# Patient Record
Sex: Female | Born: 1985 | Race: Black or African American | Hispanic: No | Marital: Single | Smoking: Former smoker
Health system: Southern US, Community
[De-identification: ages and names within clinical notes are randomized; demographics above are authoritative.]

## PROBLEM LIST (undated history)

## (undated) DIAGNOSIS — I10 Essential (primary) hypertension: Secondary | ICD-10-CM

## (undated) DIAGNOSIS — J45909 Unspecified asthma, uncomplicated: Secondary | ICD-10-CM

---

## 2010-04-15 ENCOUNTER — Encounter: Payer: Self-pay | Admitting: Obstetrics and Gynecology

## 2010-05-24 ENCOUNTER — Encounter: Payer: Self-pay | Admitting: Obstetrics and Gynecology

## 2010-05-24 ENCOUNTER — Ambulatory Visit: Payer: Self-pay | Admitting: Obstetrics & Gynecology

## 2010-06-10 ENCOUNTER — Encounter: Payer: Self-pay | Admitting: Obstetrics & Gynecology

## 2010-08-28 ENCOUNTER — Observation Stay: Payer: Self-pay | Admitting: Obstetrics & Gynecology

## 2010-10-07 ENCOUNTER — Encounter: Payer: Self-pay | Admitting: Maternal and Fetal Medicine

## 2010-11-01 ENCOUNTER — Inpatient Hospital Stay: Payer: Self-pay

## 2013-04-22 ENCOUNTER — Ambulatory Visit: Payer: Self-pay

## 2013-04-23 LAB — GC/CHLAMYDIA PROBE AMP

## 2013-04-25 ENCOUNTER — Ambulatory Visit: Payer: Self-pay

## 2013-04-27 ENCOUNTER — Ambulatory Visit: Payer: Self-pay

## 2015-03-31 ENCOUNTER — Ambulatory Visit
Admission: EM | Admit: 2015-03-31 | Discharge: 2015-03-31 | Disposition: A | Discharge status: Home / Self Care | Payer: Self-pay | Attending: Emergency Medicine | Admitting: Emergency Medicine

## 2015-03-31 ENCOUNTER — Ambulatory Visit: Payer: Self-pay

## 2015-03-31 ENCOUNTER — Emergency Department: Payer: Self-pay

## 2015-03-31 ENCOUNTER — Emergency Department
Admission: EM | Admit: 2015-03-31 | Discharge: 2015-04-01 | Disposition: A | Discharge status: Home / Self Care | Payer: Self-pay | Attending: Emergency Medicine | Admitting: Emergency Medicine

## 2015-03-31 ENCOUNTER — Encounter: Payer: Self-pay | Admitting: Radiology

## 2015-03-31 ENCOUNTER — Encounter: Payer: Self-pay | Admitting: Emergency Medicine

## 2015-03-31 DIAGNOSIS — I10 Essential (primary) hypertension: Secondary | ICD-10-CM | POA: Insufficient documentation

## 2015-03-31 DIAGNOSIS — J189 Pneumonia, unspecified organism: Secondary | ICD-10-CM

## 2015-03-31 DIAGNOSIS — Z79899 Other long term (current) drug therapy: Secondary | ICD-10-CM | POA: Insufficient documentation

## 2015-03-31 DIAGNOSIS — R000 Tachycardia, unspecified: Secondary | ICD-10-CM | POA: Insufficient documentation

## 2015-03-31 DIAGNOSIS — Z3202 Encounter for pregnancy test, result negative: Secondary | ICD-10-CM | POA: Insufficient documentation

## 2015-03-31 DIAGNOSIS — J159 Unspecified bacterial pneumonia: Secondary | ICD-10-CM | POA: Insufficient documentation

## 2015-03-31 DIAGNOSIS — J45901 Unspecified asthma with (acute) exacerbation: Secondary | ICD-10-CM | POA: Insufficient documentation

## 2015-03-31 DIAGNOSIS — R062 Wheezing: Secondary | ICD-10-CM

## 2015-03-31 DIAGNOSIS — Z87891 Personal history of nicotine dependence: Secondary | ICD-10-CM | POA: Insufficient documentation

## 2015-03-31 HISTORY — DX: Essential (primary) hypertension: I10

## 2015-03-31 HISTORY — DX: Unspecified asthma, uncomplicated: J45.909

## 2015-03-31 LAB — CBC WITH DIFFERENTIAL/PLATELET
Basophils Absolute: 0 10*3/uL (ref 0–0.1)
Basophils Relative: 0 %
EOS PCT: 0 %
Eosinophils Absolute: 0 10*3/uL (ref 0–0.7)
HEMATOCRIT: 36.9 % (ref 35.0–47.0)
Hemoglobin: 11.9 g/dL — ABNORMAL LOW (ref 12.0–16.0)
LYMPHS ABS: 0.4 10*3/uL — AB (ref 1.0–3.6)
LYMPHS PCT: 3 %
MCH: 27.2 pg (ref 26.0–34.0)
MCHC: 32.3 g/dL (ref 32.0–36.0)
MCV: 84.4 fL (ref 80.0–100.0)
MONO ABS: 0.2 10*3/uL (ref 0.2–0.9)
MONOS PCT: 2 %
Neutro Abs: 12 10*3/uL — ABNORMAL HIGH (ref 1.4–6.5)
Neutrophils Relative %: 95 %
PLATELETS: 364 10*3/uL (ref 150–440)
RBC: 4.38 MIL/uL (ref 3.80–5.20)
RDW: 15 % — AB (ref 11.5–14.5)
WBC: 12.6 10*3/uL — ABNORMAL HIGH (ref 3.6–11.0)

## 2015-03-31 LAB — RAPID INFLUENZA A&B ANTIGENS
Influenza A (ARMC): NOT DETECTED
Influenza B (ARMC): NOT DETECTED

## 2015-03-31 LAB — BASIC METABOLIC PANEL
Anion gap: 7 (ref 5–15)
BUN: 8 mg/dL (ref 6–20)
CALCIUM: 9.2 mg/dL (ref 8.9–10.3)
CO2: 22 mmol/L (ref 22–32)
Chloride: 109 mmol/L (ref 101–111)
Creatinine, Ser: 0.59 mg/dL (ref 0.44–1.00)
GFR calc Af Amer: >60 mL/min (ref >60)
GLUCOSE: 166 mg/dL — AB (ref 65–99)
Potassium: 3.7 mmol/L (ref 3.5–5.1)
Sodium: 138 mmol/L (ref 135–145)

## 2015-03-31 LAB — POC URINE PREG, ED: Preg Test, Ur: NEGATIVE

## 2015-03-31 MED ORDER — ALBUTEROL SULFATE (2.5 MG/3ML) 0.083% IN NEBU
10.0000 mg/h | INHALATION_SOLUTION | Freq: Once | RESPIRATORY_TRACT | Status: DC
  Filled 2015-03-31: qty 3

## 2015-03-31 MED ORDER — IPRATROPIUM BROMIDE 0.02 % IN SOLN
0.5000 mg | Freq: Once | RESPIRATORY_TRACT | Status: AC
  Administered 2015-03-31: 0.5 mg via RESPIRATORY_TRACT

## 2015-03-31 MED ORDER — IPRATROPIUM BROMIDE 0.02 % IN SOLN
RESPIRATORY_TRACT | Status: AC
  Administered 2015-03-31: 0.5 mg via RESPIRATORY_TRACT
  Filled 2015-03-31: qty 5

## 2015-03-31 MED ORDER — IPRATROPIUM BROMIDE 0.02 % IN SOLN
0.5000 mg | RESPIRATORY_TRACT | Status: AC
  Administered 2015-03-31: 0.5 mg via RESPIRATORY_TRACT

## 2015-03-31 MED ORDER — MAGNESIUM SULFATE 2 GM/50ML IV SOLN
2.0000 g | Freq: Once | INTRAVENOUS | Status: AC
  Administered 2015-03-31: 2 g via INTRAVENOUS
  Filled 2015-03-31: qty 50

## 2015-03-31 MED ORDER — IPRATROPIUM-ALBUTEROL 0.5-2.5 (3) MG/3ML IN SOLN
3.0000 mL | Freq: Once | RESPIRATORY_TRACT | Status: AC
  Administered 2015-03-31: 3 mL via RESPIRATORY_TRACT

## 2015-03-31 MED ORDER — IPRATROPIUM-ALBUTEROL 0.5-2.5 (3) MG/3ML IN SOLN
RESPIRATORY_TRACT | Status: AC
  Administered 2015-03-31: 3 mL via RESPIRATORY_TRACT
  Filled 2015-03-31: qty 3

## 2015-03-31 MED ORDER — IPRATROPIUM-ALBUTEROL 0.5-2.5 (3) MG/3ML IN SOLN
3.0000 mL | Freq: Four times a day (QID) | RESPIRATORY_TRACT | Status: DC
  Administered 2015-03-31: 3 mL via RESPIRATORY_TRACT

## 2015-03-31 MED ORDER — ALBUTEROL SULFATE (2.5 MG/3ML) 0.083% IN NEBU
2.5000 mg | INHALATION_SOLUTION | Freq: Once | RESPIRATORY_TRACT | Status: AC
  Administered 2015-03-31: 2.5 mg via RESPIRATORY_TRACT
  Filled 2015-03-31: qty 3

## 2015-03-31 MED ORDER — PREDNISONE 20 MG PO TABS: 40.0000 mg | ORAL_TABLET | Freq: Every day | ORAL | Status: DC

## 2015-03-31 MED ORDER — PREDNISONE 50 MG PO TABS
60.0000 mg | ORAL_TABLET | Freq: Once | ORAL | Status: AC
  Administered 2015-03-31: 60 mg via ORAL

## 2015-03-31 MED ORDER — LEVOFLOXACIN 750 MG PO TABS
750.0000 mg | ORAL_TABLET | Freq: Once | ORAL | Status: AC
  Administered 2015-03-31: 750 mg via ORAL
  Filled 2015-03-31: qty 1

## 2015-03-31 MED ORDER — ALBUTEROL SULFATE (2.5 MG/3ML) 0.083% IN NEBU
2.5000 mg | INHALATION_SOLUTION | Freq: Once | RESPIRATORY_TRACT | Status: AC
  Administered 2015-03-31: 2.5 mg via RESPIRATORY_TRACT

## 2015-03-31 MED ORDER — ALBUTEROL SULFATE HFA 108 (90 BASE) MCG/ACT IN AERS: 2.0000 | INHALATION_SPRAY | Freq: Four times a day (QID) | RESPIRATORY_TRACT | Status: DC | PRN

## 2015-03-31 MED ORDER — LEVOFLOXACIN 750 MG PO TABS: 750.0000 mg | ORAL_TABLET | Freq: Every day | ORAL | Status: AC

## 2015-03-31 MED ORDER — IOHEXOL 350 MG/ML SOLN
100.0000 mL | Freq: Once | INTRAVENOUS | Status: AC | PRN
Start: 2015-03-31 — End: 2015-03-31
  Administered 2015-03-31: 100 mL via INTRAVENOUS

## 2015-03-31 NOTE — ED Notes (Signed)
Patient transported to CT 

## 2015-03-31 NOTE — Discharge Instructions (Signed)
Go directly to the emergency department as discussed. This is very important.   Follow up with your primary care physician as directed. Return to Urgent care for new or worsening concerns.   Asthma, Acute Bronchospasm Acute bronchospasm caused by asthma is also referred to as an asthma attack. Bronchospasm means your air passages become narrowed. The narrowing is caused by inflammation and tightening of the muscles in the air tubes (bronchi) in your lungs. This can make it hard to breathe or cause you to wheeze and cough. CAUSES Possible triggers are:  Animal dander from the skin, hair, or feathers of animals.  Dust mites contained in house dust.  Cockroaches.  Pollen from trees or grass.  Mold.  Cigarette or tobacco smoke.  Air pollutants such as dust, household cleaners, hair sprays, aerosol sprays, paint fumes, strong chemicals, or strong odors.  Cold air or weather changes. Cold air may trigger inflammation. Winds increase molds and pollens in the air.  Strong emotions such as crying or laughing hard.  Stress.  Certain medicines such as aspirin or beta-blockers.  Sulfites in foods and drinks, such as dried fruits and wine.  Infections or inflammatory conditions, such as a flu, cold, or inflammation of the nasal membranes (rhinitis).  Gastroesophageal reflux disease (GERD). GERD is a condition where stomach acid backs up into your esophagus.  Exercise or strenuous activity. SIGNS AND SYMPTOMS   Wheezing.  Excessive coughing, particularly at night.  Chest tightness.  Shortness of breath. DIAGNOSIS  Your health care provider will ask you about your medical history and perform a physical exam. A chest X-ray or blood testing may be performed to look for other causes of your symptoms or other conditions that may have triggered your asthma attack. TREATMENT  Treatment is aimed at reducing inflammation and opening up the airways in your lungs. Most asthma attacks are  treated with inhaled medicines. These include quick relief or rescue medicines (such as bronchodilators) and controller medicines (such as inhaled corticosteroids). These medicines are sometimes given through an inhaler or a nebulizer. Systemic steroid medicine taken by mouth or given through an IV tube also can be used to reduce the inflammation when an attack is moderate or severe. Antibiotic medicines are only used if a bacterial infection is present.  HOME CARE INSTRUCTIONS   Rest.  Drink plenty of liquids. This helps the mucus to remain thin and be easily coughed up. Only use caffeine in moderation and do not use alcohol until you have recovered from your illness.  Do not smoke. Avoid being exposed to secondhand smoke.  You play a critical role in keeping yourself in good health. Avoid exposure to things that cause you to wheeze or to have breathing problems.  Keep your medicines up-to-date and available. Carefully follow your health care provider's treatment plan.  Take your medicine exactly as prescribed.  When pollen or pollution is bad, keep windows closed and use an air conditioner or go to places with air conditioning.  Asthma requires careful medical care. See your health care provider for a follow-up as advised. If you are more than [redacted] weeks pregnant and you were prescribed any new medicines, let your obstetrician know about the visit and how you are doing. Follow up with your health care provider as directed.  After you have recovered from your asthma attack, make an appointment with your outpatient doctor to talk about ways to reduce the likelihood of future attacks. If you do not have a doctor who manages your  asthma, make an appointment with a primary care doctor to discuss your asthma. SEEK IMMEDIATE MEDICAL CARE IF:   You are getting worse.  You have trouble breathing. If severe, call your local emergency services (911 in the [place]  You develop chest pain or  discomfort.  You are vomiting.  You are not able to keep fluids down.  You are coughing up yellow, green, brown, or bloody sputum.  You have a fever and your symptoms suddenly get worse.  You have trouble swallowing. MAKE SURE YOU:   Understand these instructions.  Will watch your condition.  Will get help right away if you are not doing well or get worse.   This information is not intended to replace advice given to you by your health care provider. Make sure you discuss any questions you have with your health care provider.   Document Released: 09/21/2006 Document Revised: 06/11/2013 Document Reviewed: 12/12/2012 Elsevier Interactive Patient Education [employer]2016 [employer] Inc.

## 2015-03-31 NOTE — Discharge Instructions (Signed)
Please seek medical attention for any high fevers, chest pain, shortness of breath, change in behavior, persistent vomiting, bloody stool or any other new or concerning symptoms. ° °Community-Acquired Pneumonia, Adult °Pneumonia is an infection of the lungs. There are different types of pneumonia. One type can develop while a person is in a hospital. A different type, called community-acquired pneumonia, develops in people who are not, or have not recently been, in the hospital or other health care facility.  °CAUSES °Pneumonia may be caused by bacteria, viruses, or funguses. Community-acquired pneumonia is often caused by Streptococcus pneumonia bacteria. These bacteria are often passed from one person to another by breathing in droplets from the cough or sneeze of an infected person. °RISK FACTORS °The condition is more likely to develop in: °· People who have chronic diseases, such as chronic obstructive pulmonary disease (COPD), asthma, congestive heart failure, cystic fibrosis, diabetes, or kidney disease. °· People who have early-stage or late-stage HIV. °· People who have sickle cell disease. °· People who have had their spleen removed (splenectomy). °· People who have poor dental hygiene. °· People who have medical conditions that increase the risk of breathing in (aspirating) secretions their own mouth and nose.   °· People who have a weakened immune system (immunocompromised). °· People who smoke. °· People who travel to areas where pneumonia-causing germs commonly exist. °· People who are around animal habitats or animals that have pneumonia-causing germs, including birds, bats, rabbits, cats, and farm animals. °SYMPTOMS °Symptoms of this condition include: °· A dry cough. °· A wet (productive) cough. °· Fever. °· Sweating. °· Chest pain, especially when breathing deeply or coughing. °· Rapid breathing or difficulty breathing. °· Shortness of breath. °· Shaking chills. °· Fatigue. °· Muscle  aches. °DIAGNOSIS °Your health care provider will take a medical history and perform a physical exam. You may also have other tests, including: °· Imaging studies of your chest, including X-rays. °· Tests to check your blood oxygen level and other blood gases. °· Other tests on blood, mucus (sputum), fluid around your lungs (pleural fluid), and urine. °If your pneumonia is severe, other tests may be done to identify the specific cause of your illness. °TREATMENT °The type of treatment that you receive depends on many factors, such as the cause of your pneumonia, the medicines you take, and other medical conditions that you have. For most adults, treatment and recovery from pneumonia may occur at home. In some cases, treatment must happen in a hospital. Treatment may include: °· Antibiotic medicines, if the pneumonia was caused by bacteria. °· Antiviral medicines, if the pneumonia was caused by a virus. °· Medicines that are given by mouth or through an IV tube. °· Oxygen. °· Respiratory therapy. °Although rare, treating severe pneumonia may include: °· Mechanical ventilation. This is done if you are not breathing well on your own and you cannot maintain a safe blood oxygen level. °· Thoracentesis. This procedure removes fluid around one lung or both lungs to help you breathe better. °HOME CARE INSTRUCTIONS °· Take over-the-counter and prescription medicines only as told by your health care provider. °¨ Only take cough medicine if you are losing sleep. Understand that cough medicine can prevent your body's natural ability to remove mucus from your lungs. °¨ If you were prescribed an antibiotic medicine, take it as told by your health care provider. Do not stop taking the antibiotic even if you start to feel better. °· Sleep in a semi-upright position at night. Try sleeping in a reclining chair, or place a few pillows under your head. °· Do   Do not use tobacco products, including cigarettes, chewing tobacco, and  e-cigarettes. If you need help quitting, ask your health care provider.  Drink enough water to keep your urine clear or pale yellow. This will help to thin out mucus secretions in your lungs. PREVENTION There are ways that you can decrease your risk of developing community-acquired pneumonia. Consider getting a pneumococcal vaccine if:  You are older than 31 years of age.  You are older than 31 years of age and are undergoing cancer treatment, have chronic lung disease, or have other medical conditions that affect your immune system. Ask your health care provider if this applies to you. There are different types and schedules of pneumococcal vaccines. Ask your health care provider which vaccination option is best for you. You may also prevent community-acquired pneumonia if you take these actions:  Get an influenza vaccine every year. Ask your health care provider which type of influenza vaccine is best for you.  Go to the dentist on a regular basis.  Wash your hands often. Use hand sanitizer if soap and water are not available. SEEK MEDICAL CARE IF:  You have a fever.  You are losing sleep because you cannot control your cough with cough medicine. SEEK IMMEDIATE MEDICAL CARE IF:  You have worsening shortness of breath.  You have increased chest pain.  Your sickness becomes worse, especially if you are an older adult or have a weakened immune system.  You cough up blood.   This information is not intended to replace advice given to you by your health care provider. Make sure you discuss any questions you have with your health care provider.   Document Released: 06/06/2005 Document Revised: 02/25/2015 Document Reviewed: 10/01/2014 Elsevier Interactive Patient Education 2016 Elsevier Inc.   Bronchospasm, Adult A bronchospasm is when the tubes that carry air in and out of your lungs (airways) spasm or tighten. During a bronchospasm it is hard to breathe. This is because the  airways get smaller. A bronchospasm can be triggered by:  Allergies. These may be to animals, pollen, food, or mold.  Infection. This is a common cause of bronchospasm.  Exercise.  Irritants. These include pollution, cigarette smoke, strong odors, aerosol sprays, and paint fumes.  Weather changes.  Stress.  Being emotional. HOME CARE   Always have a plan for getting help. Know when to call your doctor and local emergency services (911 in the [place] Know where you can get emergency care.  Only take medicines as told by your doctor.  If you were prescribed an inhaler or nebulizer machine, ask your doctor how to use it correctly. Always use a spacer with your inhaler if you were given one.  Stay calm during an attack. Try to relax and breathe more slowly.  Control your home environment:  Change your heating and air conditioning filter at least once a month.  Limit your use of fireplaces and wood stoves.  Do not  smoke. Do not  allow smoking in your home.  Avoid perfumes and fragrances.  Get rid of pests (such as roaches and mice) and their droppings.  Throw away plants if you see mold on them.  Keep your house clean and dust free.  Replace carpet with wood, tile, or vinyl flooring. Carpet can trap dander and dust.  Use allergy-proof pillows, mattress covers, and box spring covers.  Wash bed sheets and blankets every week in hot water. Dry them in a dryer.  Use blankets that are made of polyester  or cotton.  Wash hands frequently. GET HELP IF:  You have muscle aches.  You have chest pain.  The thick spit you spit or cough up (sputum) changes from clear or white to yellow, green, gray, or bloody.  The thick spit you spit or cough up gets thicker.  There are problems that may be related to the medicine you are given such as:  A rash.  Itching.  Swelling.  Trouble breathing. GET HELP RIGHT AWAY IF:  You feel you cannot breathe or catch your  breath.  You cannot stop coughing.  Your treatment is not helping you breathe better.  You have very bad chest pain. MAKE SURE YOU:   Understand these instructions.  Will watch your condition.  Will get help right away if you are not doing well or get worse.   This information is not intended to replace advice given to you by your health care provider. Make sure you discuss any questions you have with your health care provider.   Document Released: 04/03/2009 Document Revised: 06/27/2014 Document Reviewed: 11/27/2012 Elsevier Interactive Patient Education [employer]

## 2015-03-31 NOTE — ED Provider Notes (Signed)
[facility] Emergency Department Provider Note    ____________________________________________  Time seen: 42  I have reviewed the triage vital signs and the nursing notes.   HISTORY  Chief Complaint Shortness of Breath   History limited by: Not Limited   HPI Jessica Humphrey is a 29 y.o. female with history of asthma as a child who presents to the emergency department today because of concerns for congestion and wheezing. The patient has had these symptoms for the past 2 days. She states they've gradually gotten worse. She has had some cough. She denies any chest pain. She was seen at urgent care just prior to the emergency department visit where a chest x-ray was done as well as doing nebs and steroids were given. The chest x-ray did not show any pneumonia or any other concerning finding.  Past Medical History  Diagnosis Date  . Asthma   . Hypertension     There are no active problems to display for this patient.   Past Surgical History  Procedure Laterality Date  . Cesarean section      Current Outpatient Rx  Name  Route  Sig  Dispense  Refill  . albuterol (PROVENTIL HFA;VENTOLIN HFA) 108 (90 BASE) MCG/ACT inhaler   Inhalation   Inhale 2 puffs into the lungs every 6 (six) hours as needed for wheezing or shortness of breath.         . lisinopril-hydrochlorothiazide (PRINZIDE,ZESTORETIC) 20-25 MG tablet   Oral   Take 1 tablet by mouth daily.           Allergies Review of patient's allergies indicates no known allergies.  No family history on file.  Social History Social History  Substance Use Topics  . Smoking status: Former Games developer  . Smokeless tobacco: Never Used  . Alcohol Use: No    Review of Systems Constitutional: Negative for fever. Cardiovascular: Negative for chest pain. Respiratory: Positive for shortness of breath. Gastrointestinal: Negative for abdominal pain, vomiting and diarrhea. Genitourinary: Negative  for dysuria. Musculoskeletal: Negative for back pain. Skin: Negative for rash. Neurological: Negative for headaches, focal weakness or numbness.   10-point ROS otherwise negative.  ____________________________________________   PHYSICAL EXAM:  VITAL SIGNS: ED Triage Vitals  Enc Vitals Group     BP 03/31/15 1750 160/92 mmHg     Pulse Rate 03/31/15 1750 138     Resp 03/31/15 1750 20     Temp 03/31/15 1750 99.5 F (37.5 C)     Temp Source 03/31/15 1750 Oral     SpO2 03/31/15 1750 94 %     Weight 03/31/15 1750 192 lb (87.091 kg)     Height 03/31/15 1750  (1.575 m)     Head Cir --      Peak Flow --      Pain Score 03/31/15 1805 0   Constitutional: Alert and oriented. Well appearing and in no distress. Eyes: Conjunctivae are normal. PERRL. Normal extraocular movements. ENT   Head: Normocephalic and atraumatic.   Nose: No congestion/rhinnorhea.   Mouth/Throat: Mucous membranes are moist.   Neck: No stridor. Hematological/Lymphatic/Immunilogical: No cervical lymphadenopathy. Cardiovascular: Tachycardia, regular rhythm.  No murmurs, rubs, or gallops. Respiratory: Mildly increased respiratory effort, bilateral diffuse wheezing. Gastrointestinal: Soft and nontender. No distention. Genitourinary: Deferred Musculoskeletal: Normal range of motion in all extremities. No joint effusions.  No lower extremity tenderness nor edema. Neurologic:  Normal speech and language. No gross focal neurologic deficits are appreciated. Speech is normal.  Skin:  Skin  is warm, dry and intact. No rash noted. Psychiatric: Mood and affect are normal. Speech and behavior are normal. Patient exhibits appropriate insight and judgment.  ____________________________________________    LABS (pertinent positives/negatives)  Labs Reviewed  CBC WITH DIFFERENTIAL/PLATELET - Abnormal; Notable for the following:    WBC 12.6 (*)    Hemoglobin 11.9 (*)    RDW 15.0 (*)    Neutro Abs 12.0 (*)     Lymphs Abs 0.4 (*)    All other components within normal limits  BASIC METABOLIC PANEL     ____________________________________________   EKG  I, Phineas Semen, attending physician, personally viewed and interpreted this EKG  EKG Time: 1758 Rate: 142 Rhythm: sinus tachycardia Axis: normal Intervals: qtc 415 QRS: narrow ST changes: no st elevation Impression: sinus tachycardia, otherwise normal ekg   ____________________________________________    RADIOLOGY  CXR from urgent care IMPRESSION: No abnormality noted.   ____________________________________________   PROCEDURES  Procedure(s) performed: None  Critical Care performed: No  ____________________________________________   INITIAL IMPRESSION / ASSESSMENT AND PLAN / ED COURSE  Pertinent labs & imaging results that were available during my care of the patient were reviewed by me and considered in my medical decision making (see chart for details).  Patient presents to the emergency department today because of concerns for wheezing and shortness of breath. On exam patient did have diffuse bilateral wheezing. Patient's heart rate is elevated likely secondary from the DuoNeb's received at urgent care as I will hold off on giving further DuoNeb's. We will however write for magnesium. Will observe patient.  ----------------------------------------- 9:29 PM on 03/31/2015 -----------------------------------------  Patient states that she feels better and wheezing does seem improved on exam at this time. Magnesium is still infusing. It is possible that this steroids or kicking and at this point. Will prepare paperwork in the event the patient continues to improve and is able to be discharged after medications.  ____________________________________________   FINAL CLINICAL IMPRESSION(S) / ED DIAGNOSES  Final diagnoses:  Wheezing     Phineas Semen, MD 03/31/15 2129

## 2015-03-31 NOTE — ED Notes (Signed)
Patient c/o cough and chest congestion since Sunday.  

## 2015-03-31 NOTE — ED Notes (Signed)
Patient states shortness of breath started Sunday at work and has gotten worse. Patient was seen at the [facility] today and was given one albuterol neb, 2 Duo nebs and 60 of prednisone and then sent here. Report was called to this RN by [facility]. Patient has a history of asthma.

## 2015-03-31 NOTE — ED Notes (Signed)
MD verbalized to hold albuterol order due to pts tachycardia.

## 2015-03-31 NOTE — ED Notes (Signed)
MD at bedside to update pt

## 2015-03-31 NOTE — ED Provider Notes (Signed)
-----------------------------------------   9:41 PM on 03/31/2015 -----------------------------------------  Blood pressure 141/97, pulse 125, temperature 99.5 F (37.5 C), temperature source Oral, resp. rate 16, height  (1.575 m), weight 192 lb (87.091 kg), last menstrual period 03/31/2015, SpO2 99 %.  Assuming care from Dr. Derrill Kay.  In short, Jessica Humphrey is a 29 y.o. female with a chief complaint of Shortness of Breath .  Refer to the original H&P for additional details.  The current plan of care is to reassess.  ----------------------------------------- 10:22 PM on 03/31/2015 -----------------------------------------  The patient feels like her chest tightening and shortness of breath is again worsening.  She is still tachycardia in the 120s.  Given this combination of symptoms and the apparent lack of success with treatment with nebulizer treatments, I feel it is appropriate to rule out a pulmonary embolism at this time with a CTA chest.    ----------------------------------------- 11:41 PM on 03/31/2015 -----------------------------------------  No evidence of pulmonary embolism on CT.  Question possible infection.  Given her symptoms, subjective fevers, history of asthma, etc., I will treat with antibiotics as well as treatment for asthma exacerbation.  I discussed plan with the patient.  She states she feels a little bit better and is ready to go home.  We will do one last albuterol treatment before she goes home.  Dg Chest 2 View  03/31/2015   CLINICAL DATA:  Cough and congestion; wheezing  EXAM: CHEST  2 VIEW  COMPARISON:  None.  FINDINGS: Lungs are clear. Heart size and pulmonary vascularity are normal. No adenopathy. No bone lesions.  IMPRESSION: No abnormality noted.   Electronically Signed   By: Bretta Bang III M.D.   On: 03/31/2015 13:57   Ct Angio Chest Pe W/cm &/or Wo Cm  03/31/2015   CLINICAL DATA:  29 year old female with acute shortness of breath and  tachycardia.  EXAM: CT ANGIOGRAPHY CHEST WITH CONTRAST  TECHNIQUE: Multidetector CT imaging of the chest was performed using the standard protocol during bolus administration of intravenous contrast. Multiplanar CT image reconstructions and MIPs were obtained to evaluate the vascular anatomy.  CONTRAST:  OMNIPAQUE IOHEXOL 350 MG/ML SOLN  COMPARISON:  None.  FINDINGS: This is a technically adequate study although mild respiratory motion artifact in the lower lungs decreases sensitivity.  Mediastinum/Nodes: No pulmonary emboli are identified. The heart and great vessels are within normal limits. There is no evidence of thoracic aortic aneurysm. No pericardial effusion or enlarged lymph nodes identified.  Lungs/Pleura: A few scattered ill-defined opacities are identified within the medial upper and lower lobes and may represent atelectasis or possibly nonspecific inflammation/ infection. There is no evidence of consolidation, nodule, mass or endobronchial/endotracheal lesion. There is no evidence of pleural effusion or pneumothorax.  Upper abdomen: No acute abnormalities.  Musculoskeletal: No acute or suspicious abnormalities.  Review of the MIP images confirms the above findings.  IMPRESSION: No evidence of pulmonary emboli or thoracic aortic aneurysm.  A few scattered ill-defined opacities within the medial lungs bilaterally - question atelectasis over infection/inflammation.   Electronically Signed   By: Harmon Pier M.D.   On: 03/31/2015 22:59     Loleta Rose, MD 03/31/15 2342

## 2015-03-31 NOTE — ED Provider Notes (Signed)
[facility] Emergency Department Provider Note  ____________________________________________  Time seen: Approximately 1:39 PM  I have reviewed the triage vital signs and the nursing notes.   HISTORY   Chief Complaint Cough    HPI Jessica Humphrey is a 29 y.o. female presents for 2-3 days of runny nose, cough and congestion. Reports since yesterday with intermittent wheezing and cough. States today with what feels like constant wheezing. States history of asthma as a child and occasionally has wheezing when sick. States does not have inhaler at home any more. States has chest tightness and wheezing. Denies chest pain.  Reports some shortness of breath when wheezing. Reports continues to eat and drink well. Denies fever at home. States some sinus pressure. Denies sore throat. Denies vomiting, diarrhea, abdominal pain, nausea, extremity swelling, recent travel, recent surgery, or other changes. Denies known sick contacts.   Reports history of chronic hypertension. 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 States has medication at home and will take as soon as she gets home.     Past Medical History  Diagnosis Date  . Asthma   . Hypertension     There are no active problems to display for this patient.   Past Surgical History  Procedure Laterality Date  . Cesarean section      Current Outpatient Rx  Name  Route  Sig  Dispense  Refill             . lisinopril-hydrochlorothiazide (PRINZIDE,ZESTORETIC) 20-25 MG tablet   Oral   Take 1 tablet by mouth daily.          No oral contraceptives.   Reports currently menstruating.  Reports not sexually active.    Allergies Review of patient's allergies indicates no known allergies.  History reviewed. No pertinent family history.  Social History Social History  Substance Use Topics  . Smoking status: Former Games developer  . Smokeless  tobacco: Never Used  . Alcohol Use: No    Family History: Parents HTN Denies personal or family history of PE, DVT, or clotting disorders.   Review of Systems Constitutional: No fever/chills Eyes: No visual changes. ENT: positive runny nose, nasal congestion and sinus drainage Cardiovascular: Denies chest pain. Respiratory: positive intermittent shortness of breath. Positive cough and wheezing. Gastrointestinal: No abdominal pain.  No nausea, no vomiting.  No diarrhea.  No constipation. Genitourinary: Negative for dysuria. Musculoskeletal: Negative for back pain. Skin: Negative for rash. Neurological: Negative for headaches, focal weakness or numbness.  10-point ROS otherwise negative.  ____________________________________________   PHYSICAL EXAM:  VITAL SIGNS: ED Triage Vitals  Enc Vitals Group     BP 03/31/15 1313 172/114 mmHg     Pulse Rate 03/31/15 1313 120     Resp 03/31/15 1313 18     Temp 03/31/15 1313 98.5 F (36.9 C)     Temp Source 03/31/15 1313 Tympanic     SpO2 03/31/15 1313 96 %     Weight 03/31/15 1313 194 lb (87.998 kg)     Height 03/31/15 1313  (1.575 m)     Head Cir --      Peak Flow --      Pain Score 03/31/15 1317 3     Pain Loc --      Pain Edu? --      Excl. in GC? --    Today's Vitals   03/31/15 1317 03/31/15 1400 03/31/15 1438 03/31/15 1444  BP:  141/86  Pulse:   140 146  Temp:      TempSrc:      Resp:      Height:      Weight:      SpO2:  100% 92% 96%  PainSc: 3         Constitutional: Alert and oriented. Well appearing and in no acute distress. Eyes: Conjunctivae are normal. PERRL. EOMI. Head: Atraumatic. No sinus TTP. No swelling or erythema.   Ears: no erythema, normal TMs bilaterally.   Nose: nasal congestion, mild clear nasal drainage  Mouth/Throat: Mucous membranes are moist.  Mild pharyngeal erythema, no tonsillar swelling or exudate. No uvular shift or deviation.  Neck: No stridor.  No cervical spine tenderness  to palpation. Hematological/Lymphatic/Immunilogical: No cervical lymphadenopathy. Cardiovascular: Normal rate, regular rhythm. Grossly normal heart sounds.  Good peripheral circulation. Respiratory: Normal respiratory effort.  No retractions. No rhonchi or rales. Inspiratory and expiratory wheezes throughout. Decreased air movement.  Gastrointestinal: Soft and nontender. No distention. Normal Bowel sounds. No CVA tenderness. Musculoskeletal: No lower or upper extremity tenderness nor edema.  No joint effusions. Bilateral pedal pulses equal and easily palpated.  Neurologic:  Normal speech and language. No gross focal neurologic deficits are appreciated. No gait instability. Skin:  Skin is warm, dry and intact. No rash noted. Psychiatric: Mood and affect are normal. Speech and behavior are normal.  ____________________________________________   LABS (all labs ordered are listed, but only abnormal results are displayed)  Labs Reviewed  RAPID INFLUENZA A&B ANTIGENS ([facility] ONLY)    RADIOLOGY  EXAM: CHEST 2 VIEW  COMPARISON: None.  FINDINGS: Lungs are clear. Heart size and pulmonary vascularity are normal. No adenopathy. No bone lesions.  IMPRESSION: No abnormality noted.   Electronically Signed By: Bretta Bang III M.D. On: 03/31/2015 13:57      I, Renford Dills, personally viewed and evaluated these images (plain radiographs) as part of my medical decision making.   ____________________________________________  INITIAL IMPRESSION / ASSESSMENT AND PLAN / ED COURSE  Pertinent labs & imaging results that were available during my care of the patient were reviewed by me and considered in my medical decision making (see chart for details).  Well appearing. No acute distress. Presents for complaints of 2-3 days of runny nose, nasal congestion, cough and wheezing. Denies fever. Reports continues to eat and drink well at home. Wheezes throughout. Will chest xray,  albuterol/ipratropium nebulizer x 2, oral prednisone, and swab for flu.   1405: Patient reexamined. Patient with continued inspiratory and expiratory wheezes throughout but with improved air movement post x two oral duonebs and oral prednisone. Albuterol neb x one.   1415: Continued wheezing post third nebulizer treatment. Resting oxygenation saturation 97% at this time. Will ambulate. Influenza swab pending.   1425: Ambulatory oxygenation 91-92 %. While ambulating with MLP, patient reports shortness of breath and dizziness. Patient sat down and oxygenation return to 96% RA and patient reports no dizziness, slight continued shortness of breath. Counseled patient for need for further treatment at higher level of care. Counseled patient regarding need to go to Emergency room. Patient reports she prefers to go to [facility] ER. Patient alert and oriented with decisional capacity, and states she does not want to go by EMS, but wants her brother to transport her. Discussed risks even up to death of delaying transport, patient verbalized understanding. Patient called brother.    1440: Awaiting brother to transport. Denies current shortness of breath or dizziness. Continues with inspiratory  and expiratory wheezes throughout. No acute distress.   1450: Brother to transport to ER. Patient does report feeling better, wheezes continue. Discussed follow up with Primary care physician this week. Discussed follow up and return parameters including no resolution or any worsening concerns. Patient verbalized understanding and agreed to plan. Report called to first nurse Sonja RN at [facility] ER.  __________________________________________   FINAL CLINICAL IMPRESSION(S) / ED DIAGNOSES  Final diagnoses:  Wheezing  Asthma, unspecified asthma severity, with acute exacerbation       Renford Dills, NP 03/31/15 1556  Renford Dills, NP 03/31/15 1611  Renford Dills, NP 03/31/15 1829

## 2015-04-01 ENCOUNTER — Inpatient Hospital Stay
Admission: EM | Admit: 2015-04-01 | Discharge: 2015-04-03 | DRG: 202 | Disposition: A | Discharge status: Home / Self Care | Payer: Self-pay | Attending: Internal Medicine | Admitting: Internal Medicine

## 2015-04-01 ENCOUNTER — Encounter: Payer: Self-pay | Admitting: Emergency Medicine

## 2015-04-01 DIAGNOSIS — Z87891 Personal history of nicotine dependence: Secondary | ICD-10-CM

## 2015-04-01 DIAGNOSIS — J9691 Respiratory failure, unspecified with hypoxia: Secondary | ICD-10-CM | POA: Diagnosis present

## 2015-04-01 DIAGNOSIS — I10 Essential (primary) hypertension: Secondary | ICD-10-CM | POA: Diagnosis present

## 2015-04-01 DIAGNOSIS — Z6835 Body mass index (BMI) 35.0-35.9, adult: Secondary | ICD-10-CM

## 2015-04-01 DIAGNOSIS — E669 Obesity, unspecified: Secondary | ICD-10-CM | POA: Diagnosis present

## 2015-04-01 DIAGNOSIS — J189 Pneumonia, unspecified organism: Secondary | ICD-10-CM | POA: Diagnosis present

## 2015-04-01 DIAGNOSIS — J45901 Unspecified asthma with (acute) exacerbation: Principal | ICD-10-CM | POA: Diagnosis present

## 2015-04-01 DIAGNOSIS — R000 Tachycardia, unspecified: Secondary | ICD-10-CM | POA: Diagnosis present

## 2015-04-01 LAB — HEMOGLOBIN A1C: Hgb A1c MFr Bld: 5.5 % (ref 4.0–6.0)

## 2015-04-01 MED ORDER — LEVOFLOXACIN 750 MG PO TABS
750.0000 mg | ORAL_TABLET | Freq: Every day | ORAL | Status: DC
  Administered 2015-04-01 – 2015-04-03 (×3): 750 mg via ORAL
  Filled 2015-04-01 (×3): qty 1

## 2015-04-01 MED ORDER — ALBUTEROL SULFATE (2.5 MG/3ML) 0.083% IN NEBU
2.5000 mg | INHALATION_SOLUTION | Freq: Four times a day (QID) | RESPIRATORY_TRACT | Status: DC | PRN
  Administered 2015-04-01 – 2015-04-02 (×2): 2.5 mg via RESPIRATORY_TRACT
  Filled 2015-04-01 (×4): qty 3

## 2015-04-01 MED ORDER — ACETAMINOPHEN 325 MG PO TABS
650.0000 mg | ORAL_TABLET | Freq: Four times a day (QID) | ORAL | Status: DC | PRN
  Administered 2015-04-01: 650 mg via ORAL
  Filled 2015-04-01: qty 2

## 2015-04-01 MED ORDER — ALBUTEROL SULFATE (2.5 MG/3ML) 0.083% IN NEBU
10.0000 mg/h | INHALATION_SOLUTION | Freq: Once | RESPIRATORY_TRACT | Status: AC
  Administered 2015-04-01: 10 mg/h via RESPIRATORY_TRACT
  Filled 2015-04-01: qty 12

## 2015-04-01 MED ORDER — MORPHINE SULFATE (PF) 2 MG/ML IV SOLN: 2.0000 mg | INTRAVENOUS | Status: DC | PRN

## 2015-04-01 MED ORDER — DOCUSATE SODIUM 100 MG PO CAPS
100.0000 mg | ORAL_CAPSULE | Freq: Two times a day (BID) | ORAL | Status: DC
  Administered 2015-04-01 – 2015-04-03 (×4): 100 mg via ORAL
  Filled 2015-04-01 (×5): qty 1

## 2015-04-01 MED ORDER — ENOXAPARIN SODIUM 40 MG/0.4ML ~~LOC~~ SOLN
40.0000 mg | SUBCUTANEOUS | Status: DC
  Administered 2015-04-01: 40 mg via SUBCUTANEOUS
  Filled 2015-04-01: qty 0.4

## 2015-04-01 MED ORDER — ACETAMINOPHEN 650 MG RE SUPP: 650.0000 mg | Freq: Four times a day (QID) | RECTAL | Status: DC | PRN

## 2015-04-01 MED ORDER — ONDANSETRON HCL 4 MG PO TABS: 4.0000 mg | ORAL_TABLET | Freq: Four times a day (QID) | ORAL | Status: DC | PRN

## 2015-04-01 MED ORDER — LISINOPRIL 20 MG PO TABS
20.0000 mg | ORAL_TABLET | Freq: Every day | ORAL | Status: DC
  Administered 2015-04-01 – 2015-04-03 (×3): 20 mg via ORAL
  Filled 2015-04-01 (×3): qty 1

## 2015-04-01 MED ORDER — HYDROCHLOROTHIAZIDE 25 MG PO TABS
25.0000 mg | ORAL_TABLET | Freq: Every day | ORAL | Status: DC
  Administered 2015-04-01 – 2015-04-03 (×3): 25 mg via ORAL
  Filled 2015-04-01 (×3): qty 1

## 2015-04-01 MED ORDER — ALBUTEROL SULFATE HFA 108 (90 BASE) MCG/ACT IN AERS: 2.0000 | INHALATION_SPRAY | Freq: Four times a day (QID) | RESPIRATORY_TRACT | Status: DC | PRN

## 2015-04-01 MED ORDER — IPRATROPIUM-ALBUTEROL 0.5-2.5 (3) MG/3ML IN SOLN
3.0000 mL | RESPIRATORY_TRACT | Status: AC
  Administered 2015-04-01 (×3): 3 mL via RESPIRATORY_TRACT
  Filled 2015-04-01: qty 9

## 2015-04-01 MED ORDER — ALBUTEROL SULFATE (2.5 MG/3ML) 0.083% IN NEBU
10.0000 mg/h | INHALATION_SOLUTION | RESPIRATORY_TRACT | Status: DC
  Administered 2015-03-31: 10 mg/h via RESPIRATORY_TRACT

## 2015-04-01 MED ORDER — SODIUM CHLORIDE 0.9 % IV BOLUS (SEPSIS)
1000.0000 mL | Freq: Once | INTRAVENOUS | Status: AC
  Administered 2015-03-31: 1000 mL via INTRAVENOUS

## 2015-04-01 MED ORDER — MOMETASONE FURO-FORMOTEROL FUM 100-5 MCG/ACT IN AERO
2.0000 | INHALATION_SPRAY | Freq: Two times a day (BID) | RESPIRATORY_TRACT | Status: DC
  Administered 2015-04-01 – 2015-04-03 (×5): 2 via RESPIRATORY_TRACT
  Filled 2015-04-01: qty 8.8

## 2015-04-01 MED ORDER — SODIUM CHLORIDE 0.9 % IV BOLUS (SEPSIS)
1000.0000 mL | Freq: Once | INTRAVENOUS | Status: AC
  Administered 2015-04-01: 1000 mL via INTRAVENOUS

## 2015-04-01 MED ORDER — PNEUMOCOCCAL VAC POLYVALENT 25 MCG/0.5ML IJ INJ
0.5000 mL | INJECTION | INTRAMUSCULAR | Status: AC
  Administered 2015-04-03: 0.5 mL via INTRAMUSCULAR
  Filled 2015-04-01 (×2): qty 0.5

## 2015-04-01 MED ORDER — SODIUM CHLORIDE 0.9 % IJ SOLN
3.0000 mL | Freq: Two times a day (BID) | INTRAMUSCULAR | Status: DC
  Administered 2015-04-01 – 2015-04-03 (×5): 3 mL via INTRAVENOUS

## 2015-04-01 MED ORDER — METHYLPREDNISOLONE SODIUM SUCC 125 MG IJ SOLR
125.0000 mg | Freq: Once | INTRAMUSCULAR | Status: AC
  Administered 2015-04-01: 125 mg via INTRAVENOUS

## 2015-04-01 MED ORDER — ONDANSETRON HCL 4 MG/2ML IJ SOLN: 4.0000 mg | Freq: Four times a day (QID) | INTRAMUSCULAR | Status: DC | PRN

## 2015-04-01 MED ORDER — METHYLPREDNISOLONE SODIUM SUCC 125 MG IJ SOLR
INTRAMUSCULAR | Status: AC
  Filled 2015-04-01: qty 2

## 2015-04-01 MED ORDER — PREDNISONE 20 MG PO TABS
40.0000 mg | ORAL_TABLET | Freq: Every day | ORAL | Status: DC
  Administered 2015-04-01 – 2015-04-03 (×3): 40 mg via ORAL
  Filled 2015-04-01 (×3): qty 2

## 2015-04-01 MED ORDER — LEVALBUTEROL HCL 1.25 MG/0.5ML IN NEBU: 1.2500 mg | INHALATION_SOLUTION | Freq: Four times a day (QID) | RESPIRATORY_TRACT | Status: DC

## 2015-04-01 MED ORDER — LISINOPRIL-HYDROCHLOROTHIAZIDE 20-25 MG PO TABS: 1.0000 | ORAL_TABLET | Freq: Every day | ORAL | Status: DC

## 2015-04-01 NOTE — Care Management (Signed)
Patient admitted with asthma exacerbation and PNA.  Patient is a self pay patient and would benefit from a case management assessment.  Patient was transferred to medical floor prior to assessment being completed.  [facility] [place] [place]

## 2015-04-01 NOTE — H&P (Signed)
Jessica Humphrey is an 29 y.o. female.   Chief Complaint: SOB HPI: The patient presents emergency department complaining of shortness of breath that has worsened over the last 4 days. She's been seen in the emergency department twice for the same complaint. She is received antibiotics for presumed pneumonia but has had worsening asthma symptoms since being treated earlier today. The patient denies chest pain, fever, nausea or vomiting. In the emergency department she was found to be mildly hypoxic as well as wheezing throughout with poor air movement. Emergency department staff initiated continuous nebulizer treatments which have helped to some degree. The patient's oxygen saturations are 93% on 2 L of oxygen via nasal cannula. Due to her continued respiratory distress emergency department staff called for admission.  Past Medical History  Diagnosis Date  . Asthma   . Hypertension     Past Surgical History  Procedure Laterality Date  . Cesarean section      Family History  Problem Relation Age of Onset  .     None  Social History:  reports that she has quit smoking. She has never used smokeless tobacco. She reports that she does not drink alcohol. Her drug history is not on file.  Allergies: No Known Allergies  Prior to Admission medications   Medication Sig Start Date End Date Taking? Authorizing Provider  albuterol (PROVENTIL HFA;VENTOLIN HFA) 108 (90 BASE) MCG/ACT inhaler Inhale 2 puffs into the lungs every 6 (six) hours as needed for wheezing or shortness of breath. 03/31/15  Yes Nance Pear, MD  levofloxacin (LEVAQUIN) 750 MG tablet Take 1 tablet (750 mg total) by mouth daily. 03/31/15 04/20/15 Yes Hinda Kehr, MD  lisinopril-hydrochlorothiazide (PRINZIDE,ZESTORETIC) 20-25 MG tablet Take 1 tablet by mouth daily.   Yes Historical Provider, MD  predniSONE (DELTASONE) 20 MG tablet Take 2 tablets (40 mg total) by mouth daily. 03/31/15 03/30/16 Yes Nance Pear, MD      Results for orders placed or performed during the hospital encounter of 03/31/15 (from the past 48 hour(s))  CBC with Differential     Status: Abnormal   Collection Time: 03/31/15  8:23 PM  Result Value Ref Range   WBC 12.6 (H) 3.6 - 11.0 K/uL   RBC 4.38 3.80 - 5.20 MIL/uL   Hemoglobin 11.9 (L) 12.0 - 16.0 g/dL   HCT 36.9 35.0 - 47.0 %   MCV 84.4 80.0 - 100.0 fL   MCH 27.2 26.0 - 34.0 pg   MCHC 32.3 32.0 - 36.0 g/dL   RDW 15.0 (H) 11.5 - 14.5 %   Platelets 364 150 - 440 K/uL   Neutrophils Relative % 95 %   Neutro Abs 12.0 (H) 1.4 - 6.5 K/uL   Lymphocytes Relative 3 %   Lymphs Abs 0.4 (L) 1.0 - 3.6 K/uL   Monocytes Relative 2 %   Monocytes Absolute 0.2 0.2 - 0.9 K/uL   Eosinophils Relative 0 %   Eosinophils Absolute 0.0 0 - 0.7 K/uL   Basophils Relative 0 %   Basophils Absolute 0.0 0 - 0.1 K/uL  Basic metabolic panel     Status: Abnormal   Collection Time: 03/31/15  8:23 PM  Result Value Ref Range   Sodium 138 135 - 145 mmol/L   Potassium 3.7 3.5 - 5.1 mmol/L   Chloride 109 101 - 111 mmol/L   CO2 22 22 - 32 mmol/L   Glucose, Bld 166 (H) 65 - 99 mg/dL   BUN 8 6 - 20 mg/dL   Creatinine, Ser  0.59 0.44 - 1.00 mg/dL   Calcium 9.2 8.9 - 10.3 mg/dL   GFR calc non Af Amer >60 >60 mL/min   GFR calc Af Amer >60 >60 mL/min    Comment: (NOTE) The eGFR has been calculated using the CKD EPI equation. This calculation has not been validated in all clinical situations. eGFR's persistently <60 mL/min signify possible Chronic Kidney Disease.    Anion gap 7 5 - 15  POC Urine Pregnancy, ED     Status: None   Collection Time: 03/31/15 10:25 PM  Result Value Ref Range   Preg Test, Ur Negative Negative   Dg Chest 2 View  03/31/2015  CLINICAL DATA:  Cough and congestion; wheezing EXAM: CHEST  2 VIEW COMPARISON:  None. FINDINGS: Lungs are clear. Heart size and pulmonary vascularity are normal. No adenopathy. No bone lesions. IMPRESSION: No abnormality noted. Electronically Signed    By: Lowella Grip III M.D.   On: 03/31/2015 13:57   Ct Angio Chest Pe W/cm &/or Wo Cm  03/31/2015  CLINICAL DATA:  29 year old female with acute shortness of breath and tachycardia. EXAM: CT ANGIOGRAPHY CHEST WITH CONTRAST TECHNIQUE: Multidetector CT imaging of the chest was performed using the standard protocol during bolus administration of intravenous contrast. Multiplanar CT image reconstructions and MIPs were obtained to evaluate the vascular anatomy. CONTRAST:  132m OMNIPAQUE IOHEXOL 350 MG/ML SOLN COMPARISON:  None. FINDINGS: This is a technically adequate study although mild respiratory motion artifact in the lower lungs decreases sensitivity. Mediastinum/Nodes: No pulmonary emboli are identified. The heart and great vessels are within normal limits. There is no evidence of thoracic aortic aneurysm. No pericardial effusion or enlarged lymph nodes identified. Lungs/Pleura: A few scattered ill-defined opacities are identified within the medial upper and lower lobes and may represent atelectasis or possibly nonspecific inflammation/ infection. There is no evidence of consolidation, nodule, mass or endobronchial/endotracheal lesion. There is no evidence of pleural effusion or pneumothorax. Upper abdomen: No acute abnormalities. Musculoskeletal: No acute or suspicious abnormalities. Review of the MIP images confirms the above findings. IMPRESSION: No evidence of pulmonary emboli or thoracic aortic aneurysm. A few scattered ill-defined opacities within the medial lungs bilaterally - question atelectasis over infection/inflammation. Electronically Signed   By: JMargarette CanadaM.D.   On: 03/31/2015 22:59    Review of Systems  Constitutional: Negative for fever and chills.  HENT: Negative for sore throat and tinnitus.   Eyes: Negative for blurred vision and redness.  Respiratory: Positive for cough and shortness of breath.   Cardiovascular: Negative for chest pain, palpitations, orthopnea and PND.   Gastrointestinal: Negative for nausea, vomiting, abdominal pain and diarrhea.  Genitourinary: Negative for dysuria, urgency and frequency.  Musculoskeletal: Negative for myalgias and joint pain.  Skin: Negative for rash.       No lesions  Neurological: Negative for speech change, focal weakness and weakness.  Endo/Heme/Allergies: Does not bruise/bleed easily.       No temperature intolerance  Psychiatric/Behavioral: Negative for depression and suicidal ideas.    Blood pressure 157/100, pulse 140, temperature 98.8 F (37.1 C), temperature source Oral, resp. rate 20, height 5' 2"  (1.575 m), weight 87.091 kg (192 lb), last menstrual period 03/31/2015, SpO2 100 %. Physical Exam  Nursing note and vitals reviewed. Constitutional: She is oriented to person, place, and time. She appears well-developed and well-nourished. No distress.  HENT:  Head: Normocephalic and atraumatic.  Mouth/Throat: Oropharynx is clear and moist.  Eyes: Conjunctivae and EOM are normal. Pupils are equal, round, and  reactive to light. No scleral icterus.  Neck: Normal range of motion. Neck supple. No JVD present. No tracheal deviation present. No thyromegaly present.  Cardiovascular: Normal rate, regular rhythm and normal heart sounds.  Exam reveals no gallop and no friction rub.   No murmur heard. Respiratory: Effort normal and breath sounds normal.  GI: Soft. Bowel sounds are normal. She exhibits no distension. There is no tenderness.  Genitourinary:  Deferred  Lymphadenopathy:    She has no cervical adenopathy.  Neurological: She is alert and oriented to person, place, and time. No cranial nerve deficit. She exhibits normal muscle tone.  Skin: Skin is warm and dry. No rash noted. No erythema.  Psychiatric: She has a normal mood and affect. Her behavior is normal. Judgment and thought content normal.     Assessment and Plan: This is a 29 year old African American female admitted for acute exacerbation of asthma  and respiratory failure with hypoxia.  1. Asthma exacerbation: The patient is undergoing treatment for pneumonia which no doubt has resulted in an inflammatory response triggering exacerbation of her asthma. She states that she rarely has to use her inhaler but admits that she is not on a maintenance inhaler at home we will continue continue his breathing treatments until the patient has sniffling better air movement and maintains her oxygen saturations more than 95%. I place her on a steroid taper as well as initiating inhaled corticosteroid for anti-inflammatory effect. 2. Pneumonia: Community-acquired; continue oral antibiotics 3. Essential hypertension: Continue chlorothiazide and lisinopril 4. Obesity: BMI is 35.2; encouraged healthy diet and exercise  5. DVT prophylaxis: Lovenox 6. GI prophylaxis: None The patient is full code. Time spent on admission orders and critical care approximately 35 minutes  Harrie Foreman 04/01/2015, 5:57 AM

## 2015-04-01 NOTE — ED Notes (Signed)
Pt in with co shob since Sunday, states has hx of asthma but as a child.  Was seen at urgent care yesterday, then came to ER for worsening symptoms and discharged home with antibiotics with dx of pneumonia.  Pt back from worsening shob, labored breathing noted, pt unable to speak in full sentences with diminished breath sounds throughout.  States has had cough productive with yellow sputum.  Denies any fever at this time.

## 2015-04-01 NOTE — Progress Notes (Signed)
[facility] [place] at [facility]   PATIENT NAME: Jessica Humphrey    MR#:  [id number]  DATE OF BIRTH:  [birth year]  SUBJECTIVE:  CHIEF COMPLAINT:   Chief Complaint  Patient presents with  . Asthma  . Wheezing   SOB and wheezing. REVIEW OF SYSTEMS:  CONSTITUTIONAL: No fever, fatigue or weakness.  EYES: No blurred or double vision.  EARS, NOSE, AND THROAT: No tinnitus or ear pain.  RESPIRATORY: Has cough, shortness of breath, wheezing, no hemoptysis.  CARDIOVASCULAR: No chest pain, orthopnea, edema.  GASTROINTESTINAL: No nausea, vomiting, diarrhea or abdominal pain.  GENITOURINARY: No dysuria, hematuria.  ENDOCRINE: No polyuria, nocturia,  HEMATOLOGY: No anemia, easy bruising or bleeding SKIN: No rash or lesion. MUSCULOSKELETAL: No joint pain or arthritis.   NEUROLOGIC: No tingling, numbness, weakness.  PSYCHIATRY: No anxiety or depression.   DRUG ALLERGIES:  No Known Allergies  VITALS:  Blood pressure 131/95, pulse 137, temperature 98.2 F (36.8 C), temperature source Oral, resp. rate 19, height  (1.575 m), weight 87.7 kg (193 lb 5.5 oz), last menstrual period 03/31/2015, SpO2 96 %.  PHYSICAL EXAMINATION:  GENERAL:  29 y.o.-year-old patient lying in the bed with no acute distress.  EYES: Pupils equal, round, reactive to light and accommodation. No scleral icterus. Extraocular muscles intact.  HEENT: Head atraumatic, normocephalic. Oropharynx and nasopharynx clear.  NECK:  Supple, no jugular venous distention. No thyroid enlargement, no tenderness.  LUNGS: Normal breath sounds bilaterally, moderate wheezing, no rales,rhonchi or crepitation. No use of accessory muscles of respiration.  CARDIOVASCULAR: S1, S2 tachycardia. No murmurs, rubs, or gallops.  ABDOMEN: Soft, nontender, nondistended. Bowel sounds present. No organomegaly or mass.  EXTREMITIES: No pedal edema, cyanosis, or clubbing.  NEUROLOGIC: Cranial nerves II through XII are  intact. Muscle strength 5/5 in all extremities. Sensation intact. Gait not checked.  PSYCHIATRIC: The patient is alert and oriented x 3.  SKIN: No obvious rash, lesion, or ulcer.    LABORATORY PANEL:   CBC  Recent Labs Lab 03/31/15 2023  WBC 12.6*  HGB 11.9*  HCT 36.9  PLT 364   ------------------------------------------------------------------------------------------------------------------  Chemistries   Recent Labs Lab 03/31/15 2023  NA 138  K 3.7  CL 109  CO2 22  GLUCOSE 166*  BUN 8  CREATININE 0.59  CALCIUM 9.2   ------------------------------------------------------------------------------------------------------------------  Cardiac Enzymes No results for input(s): TROPONINI in the last 168 hours. ------------------------------------------------------------------------------------------------------------------  RADIOLOGY:  Dg Chest 2 View  03/31/2015  CLINICAL DATA:  Cough and congestion; wheezing EXAM: CHEST  2 VIEW COMPARISON:  None. FINDINGS: Lungs are clear. Heart size and pulmonary vascularity are normal. No adenopathy. No bone lesions. IMPRESSION: No abnormality noted. Electronically Signed   By: Bretta Bang III M.D.   On: 03/31/2015 13:57   Ct Angio Chest Pe W/cm &/or Wo Cm  03/31/2015  CLINICAL DATA:  29 year old female with acute shortness of breath and tachycardia. EXAM: CT ANGIOGRAPHY CHEST WITH CONTRAST TECHNIQUE: Multidetector CT imaging of the chest was performed using the standard protocol during bolus administration of intravenous contrast. Multiplanar CT image reconstructions and MIPs were obtained to evaluate the vascular anatomy. CONTRAST:  OMNIPAQUE IOHEXOL 350 MG/ML SOLN COMPARISON:  None. FINDINGS: This is a technically adequate study although mild respiratory motion artifact in the lower lungs decreases sensitivity. Mediastinum/Nodes: No pulmonary emboli are identified. The heart and great vessels are within normal limits. There  is no evidence of thoracic aortic aneurysm. No pericardial effusion or enlarged lymph nodes identified. Lungs/Pleura: A  few scattered ill-defined opacities are identified within the medial upper and lower lobes and may represent atelectasis or possibly nonspecific inflammation/ infection. There is no evidence of consolidation, nodule, mass or endobronchial/endotracheal lesion. There is no evidence of pleural effusion or pneumothorax. Upper abdomen: No acute abnormalities. Musculoskeletal: No acute or suspicious abnormalities. Review of the MIP images confirms the above findings. IMPRESSION: No evidence of pulmonary emboli or thoracic aortic aneurysm. A few scattered ill-defined opacities within the medial lungs bilaterally - question atelectasis over infection/inflammation. Electronically Signed   By: Harmon PierJeffrey  Hu M.D.   On: 03/31/2015 22:59    EKG:   Orders placed or performed during the hospital encounter of 03/31/15  . EKG 12-Lead  . EKG 12-Lead  . EKG    ASSESSMENT AND PLAN:   1. Asthma exacerbation:  Change to xopenex, dulera, continue prednisone.  2. Community-acquired Pneumonia with leukocytosis; continue levaquin and follow up CBC. 3. Essential hypertension: Continue chlorothiazide and lisinopril 4. Obesity: BMI is 35.2; encouraged healthy diet and exercise    * tachycardia, due to  Asthma exacerbation.   HR 120-150.  Tele monitor. Treat underlying disease.  All the records are reviewed and case discussed with Care Management/Social Workerr. Management plans discussed with the patient, family and they are in agreement.  CODE STATUS: full code.  TOTAL TIME TAKING CARE OF THIS PATIENT: 45 minutes.   POSSIBLE D/C IN 3 DAYS, DEPENDING ON CLINICAL CONDITION.   Shaune Pollackhen, Lakendrick Paradis M.D on 04/01/2015 at 4:46 PM  Between 7am to 6pm - Pager - [phone]  After 6pm go to [email or web address] - password [email or web address] [facility]  [facility]  Office  [phone]  CC: Primary care  physician; No PCP Per Patient

## 2015-04-01 NOTE — ED Notes (Signed)
First contact with patient

## 2015-04-01 NOTE — ED Notes (Addendum)
Pt co slight headache rates it 2/10.  Pt awaiting bed availability after 0700 per supervisor.   Pt states she feels better, but lungs still with diminished lung sounds and increased work of breathing noted.

## 2015-04-01 NOTE — ED Notes (Signed)
[place] to [place] pt was unable to sign d/c signeture pad and pt was not able to be taken out of computer system on time. Pt left ED at 0032 on 10/12. Pt was in no acute distress and was taken to car by family in a wheelchair.

## 2015-04-01 NOTE — ED Provider Notes (Signed)
[facility] Emergency Department Provider Note  ____________________________________________  Time seen: Approximately 4:05 AM  I have reviewed the triage vital signs and the nursing notes.   HISTORY  Chief Complaint Asthma and Wheezing    HPI Jessica Humphrey is a 29 y.o. female who was seen earlier today with an asthma exacerbation returns with worsening symptoms. The patient reports that she has been having shortness of breath for the past 3 days as well as some cough productive of yellowish-green phlegm. The patient was seen at urgent care today as well as here in the emergency department. The patient was given some prednisone, magnesium, Levaquin for pneumonia. The patient also received a CT scan that was negative for pulmonary embolus but showed some scattered ill-defined opacities within the medial lungs bilaterally. The patient was discharged home from the emergency department but continued to have some shortness of breath overnight so she decided to come back into the hospital. Patient reports that she did have one episode of emesis on her way back to the hospital. She does have some headache as well area at she denies any chest pain but feels tight in her chest. She also denies any abdominal pain at this time. The patient just reports that she is unable to breathe.   Past Medical History  Diagnosis Date  . Asthma   . Hypertension     Patient Active Problem List   Diagnosis Date Noted  . Asthma exacerbation 04/01/2015    Past Surgical History  Procedure Laterality Date  . Cesarean section      Current Outpatient Rx  Name  Route  Sig  Dispense  Refill  . albuterol (PROVENTIL HFA;VENTOLIN HFA) 108 (90 BASE) MCG/ACT inhaler   Inhalation   Inhale 2 puffs into the lungs every 6 (six) hours as needed for wheezing or shortness of breath.   1 Inhaler   2   . levofloxacin (LEVAQUIN) 750 MG tablet   Oral   Take 1 tablet (750 mg total) by mouth  daily.   5 tablet   0   . lisinopril-hydrochlorothiazide (PRINZIDE,ZESTORETIC) 20-25 MG tablet   Oral   Take 1 tablet by mouth daily.         . predniSONE (DELTASONE) 20 MG tablet   Oral   Take 2 tablets (40 mg total) by mouth daily.   8 tablet   0     Allergies Review of patient's allergies indicates no known allergies.  Family History  Problem Relation Age of Onset  .       Social History Social History  Substance Use Topics  . Smoking status: Former Games developer  . Smokeless tobacco: Never Used  . Alcohol Use: No    Review of Systems Constitutional: No fever/chills Eyes: No visual changes. ENT: No sore throat. Cardiovascular: Chest tightness Respiratory: Shortness of breath, productive cough, wheezing Gastrointestinal: Vomiting with No abdominal pain.    No diarrhea.  No constipation. Genitourinary: Negative for dysuria. Musculoskeletal: Negative for back pain. Skin: Negative for rash. Neurological: headaches with no focal weakness or numbness.  10-point ROS otherwise negative.  ____________________________________________   PHYSICAL EXAM:  VITAL SIGNS: ED Triage Vitals  Enc Vitals Group     BP 04/01/15 0339 157/100 mmHg     Pulse Rate 04/01/15 0339 140     Resp 04/01/15 0339 20     Temp 04/01/15 0339 98.8 F (37.1 C)     Temp Source 04/01/15 0339 Oral     SpO2 04/01/15  0339 94 %     Weight 04/01/15 0339 192 lb (87.091 kg)     Height 04/01/15 0339 5\' 2"  (1.575 m)     Head Cir --      Peak Flow --      Pain Score 04/01/15 0353 6     Pain Loc --      Pain Edu? --      Excl. in GC? --     Constitutional: Alert and oriented. Well appearing and in moderate to severe respiratory distress. Eyes: Conjunctivae are normal. PERRL. EOMI. Head: Atraumatic. Nose: No congestion/rhinnorhea. Mouth/Throat: Mucous membranes are moist.  Oropharynx non-erythematous. Cardiovascular: Tachycardia, regular rhythm. Grossly normal heart sounds.  Good peripheral  circulation. Respiratory: Increased respiratory distress with tachypnea and diffuse expiratory wheezing Gastrointestinal: Soft and nontender. No distention. Positive bowel sounds Musculoskeletal: No lower extremity tenderness nor edema.   Neurologic:  Normal speech and language. No gross focal neurologic deficits are appreciated. No gait instability. Skin:  Skin is warm, dry and intact.  Psychiatric: Mood and affect are normal.   ____________________________________________   LABS (all labs ordered are listed, but only abnormal results are displayed)  Labs Reviewed - No data to display ____________________________________________  EKG  None ____________________________________________  RADIOLOGY  None ____________________________________________   PROCEDURES  Procedure(s) performed: None  Critical Care performed: Yes, see critical care note(s)  CRITICAL CARE Performed by: Lucrezia EuropeWebster,  Allison P   Total critical care time: 30 min  Critical care time was exclusive of separately billable procedures and treating other patients.  Critical care was necessary to treat or prevent imminent or life-threatening deterioration.  Critical care was time spent personally by me on the following activities: development of treatment plan with patient and/or surrogate as well as nursing, discussions with consultants, evaluation of patient's response to treatment, examination of patient, obtaining history from patient or surrogate, ordering and performing treatments and interventions, ordering and review of laboratory studies, ordering and review of radiographic studies, pulse oximetry and re-evaluation of patient's condition.  ____________________________________________   INITIAL IMPRESSION / ASSESSMENT AND PLAN / ED COURSE  Pertinent labs & imaging results that were available during my care of the patient were reviewed by me and considered in my medical decision making (see chart for  details).  This is a 29 year old female who comes in today with some shortness of breath and respiratory distress due to an asthma exacerbation. The patient was seen and evaluated earlier and given 3 duo nebs as well as magnesium sulfate and prednisone. I did not repeat the patient's blood work as it was done earlier and was unremarkable. I will give the patient a dose of Solu-Medrol as well as a liter of normal saline and a continuous albuterol neb at 10 mg per hour. Given the severity of the patient's symptoms I feel that she needs to be admitted to the hospitalist service. The patient's care was transferred over to Dr. Sheryle Haildiamond the hospitalist who will take over the patient's treatment. ____________________________________________   FINAL CLINICAL IMPRESSION(S) / ED DIAGNOSES  Final diagnoses:  Asthma exacerbation      Rebecka ApleyAllison P Webster, MD 04/01/15 [phone]

## 2015-04-01 NOTE — ED Notes (Signed)
Pt presents to ED with difficulty breathing and wheezing. Pt has hx of asthma; states she left here a couple of hours ago after she was treated for the same. Pt reports she was improving but she quickly began wheezing again after getting home. Pt has slight increased work of breathing noted. Used inhaler at home with no relief.

## 2015-04-02 LAB — CBC
HCT: 36.5 % (ref 35.0–47.0)
HEMOGLOBIN: 11.8 g/dL — AB (ref 12.0–16.0)
MCH: 27.4 pg (ref 26.0–34.0)
MCHC: 32.4 g/dL (ref 32.0–36.0)
MCV: 84.6 fL (ref 80.0–100.0)
PLATELETS: 368 10*3/uL (ref 150–440)
RBC: 4.31 MIL/uL (ref 3.80–5.20)
RDW: 15.2 % — ABNORMAL HIGH (ref 11.5–14.5)
WBC: 14.3 10*3/uL — AB (ref 3.6–11.0)

## 2015-04-02 LAB — GLUCOSE, CAPILLARY
Glucose-Capillary: 160 mg/dL — ABNORMAL HIGH (ref 65–99)
Glucose-Capillary: 140 mg/dL — ABNORMAL HIGH (ref 65–99)

## 2015-04-02 MED ORDER — LEVALBUTEROL HCL 1.25 MG/0.5ML IN NEBU
1.2500 mg | INHALATION_SOLUTION | RESPIRATORY_TRACT | Status: DC
  Administered 2015-04-02: 15:00:00 1.25 mg via RESPIRATORY_TRACT
  Filled 2015-04-02: qty 0.5

## 2015-04-02 MED ORDER — INSULIN ASPART 100 UNIT/ML ~~LOC~~ SOLN
0.0000 [IU] | Freq: Three times a day (TID) | SUBCUTANEOUS | Status: DC
Start: 2015-04-02 — End: 2015-04-03
  Administered 2015-04-02: 2 [IU] via SUBCUTANEOUS
  Filled 2015-04-02: qty 2

## 2015-04-02 MED ORDER — INSULIN ASPART 100 UNIT/ML ~~LOC~~ SOLN: 0.0000 [IU] | Freq: Every day | SUBCUTANEOUS | Status: DC

## 2015-04-02 MED ORDER — LEVALBUTEROL HCL 1.25 MG/0.5ML IN NEBU
1.2500 mg | INHALATION_SOLUTION | RESPIRATORY_TRACT | Status: DC
  Administered 2015-04-02 – 2015-04-03 (×4): 1.25 mg via RESPIRATORY_TRACT
  Filled 2015-04-02 (×5): qty 0.5

## 2015-04-02 NOTE — Progress Notes (Signed)
[facility] - [place] at [facility]   PATIENT NAME: Jessica Humphrey    MR#:  [id number]  DATE OF BIRTH:  August 14, 1985  SUBJECTIVE:  CHIEF COMPLAINT:   Chief Complaint  Patient presents with  . Asthma  . Wheezing   Still cough, SOB and wheezing. HR 120-150. REVIEW OF SYSTEMS:  CONSTITUTIONAL: No fever, fatigue or weakness.  EYES: No blurred or double vision.  EARS, NOSE, AND THROAT: No tinnitus or ear pain.  RESPIRATORY: Has cough, shortness of breath, wheezing, no hemoptysis.  CARDIOVASCULAR: No chest pain, orthopnea, edema.  GASTROINTESTINAL: No nausea, vomiting, diarrhea or abdominal pain.  GENITOURINARY: No dysuria, hematuria.  ENDOCRINE: No polyuria, nocturia,  HEMATOLOGY: No anemia, easy bruising or bleeding SKIN: No rash or lesion. MUSCULOSKELETAL: No joint pain or arthritis.   NEUROLOGIC: No tingling, numbness, weakness.  PSYCHIATRY: No anxiety or depression.   DRUG ALLERGIES:  No Known Allergies  VITALS:  Blood pressure 130/91, pulse 119, temperature 98.7 F (37.1 C), temperature source Oral, resp. rate 18, height  (1.575 m), weight 87.227 kg (192 lb 4.8 oz), last menstrual period 03/31/2015, SpO2 94 %.  PHYSICAL EXAMINATION:  GENERAL:  29 y.o.-year-old patient lying in the bed with no acute distress.  EYES: Pupils equal, round, reactive to light and accommodation. No scleral icterus. Extraocular muscles intact.  HEENT: Head atraumatic, normocephalic. Oropharynx and nasopharynx clear.  NECK:  Supple, no jugular venous distention. No thyroid enlargement, no tenderness.  LUNGS: Normal breath sounds bilaterally, mild wheezing, no rales,rhonchi or crepitation. No use of accessory muscles of respiration.  CARDIOVASCULAR: S1, S2 tachycardia. No murmurs, rubs, or gallops.  ABDOMEN: Soft, nontender, nondistended. Bowel sounds present. No organomegaly or mass.  EXTREMITIES: No pedal edema, cyanosis, or clubbing.  NEUROLOGIC: Cranial nerves II  through XII are intact. Muscle strength 5/5 in all extremities. Sensation intact. Gait not checked.  PSYCHIATRIC: The patient is alert and oriented x 3.  SKIN: No obvious rash, lesion, or ulcer.    LABORATORY PANEL:   CBC  Recent Labs Lab 04/02/15 0501  WBC 14.3*  HGB 11.8*  HCT 36.5  PLT 368   ------------------------------------------------------------------------------------------------------------------  Chemistries   Recent Labs Lab 03/31/15 2023  NA 138  K 3.7  CL 109  CO2 22  GLUCOSE 166*  BUN 8  CREATININE 0.59  CALCIUM 9.2   ------------------------------------------------------------------------------------------------------------------  Cardiac Enzymes No results for input(s): TROPONINI in the last 168 hours. ------------------------------------------------------------------------------------------------------------------  RADIOLOGY:  Ct Angio Chest Pe W/cm &/or Wo Cm  03/31/2015  CLINICAL DATA:  29 year old female with acute shortness of breath and tachycardia. EXAM: CT ANGIOGRAPHY CHEST WITH CONTRAST TECHNIQUE: Multidetector CT imaging of the chest was performed using the standard protocol during bolus administration of intravenous contrast. Multiplanar CT image reconstructions and MIPs were obtained to evaluate the vascular anatomy. CONTRAST:  OMNIPAQUE IOHEXOL 350 MG/ML SOLN COMPARISON:  None. FINDINGS: This is a technically adequate study although mild respiratory motion artifact in the lower lungs decreases sensitivity. Mediastinum/Nodes: No pulmonary emboli are identified. The heart and great vessels are within normal limits. There is no evidence of thoracic aortic aneurysm. No pericardial effusion or enlarged lymph nodes identified. Lungs/Pleura: A few scattered ill-defined opacities are identified within the medial upper and lower lobes and may represent atelectasis or possibly nonspecific inflammation/ infection. There is no evidence of  consolidation, nodule, mass or endobronchial/endotracheal lesion. There is no evidence of pleural effusion or pneumothorax. Upper abdomen: No acute abnormalities. Musculoskeletal: No acute or suspicious abnormalities. Review of  the MIP images confirms the above findings. IMPRESSION: No evidence of pulmonary emboli or thoracic aortic aneurysm. A few scattered ill-defined opacities within the medial lungs bilaterally - question atelectasis over infection/inflammation. Electronically Signed   By: Harmon PierJeffrey  Hu M.D.   On: 03/31/2015 22:59    EKG:   Orders placed or performed during the hospital encounter of 03/31/15  . EKG 12-Lead  . EKG 12-Lead  . EKG    ASSESSMENT AND PLAN:   1. Asthma exacerbation:  Continue xopenex, dulera, continue prednisone.  2. Community-acquired Pneumonia with leukocytosis; continue levaquin and follow up CBC. 3. Essential hypertension: Continue chlorothiazide and lisinopril 4. Obesity: BMI is 35.2; encouraged healthy diet and exercise    * tachycardia, due to  Asthma exacerbation.   HR 120-150.  Tele monitor. Treat underlying disease.  All the records are reviewed and case discussed with Care Management/Social Workerr. Management plans discussed with the patient, family and they are in agreement.  CODE STATUS: full code.  TOTAL TIME TAKING CARE OF THIS PATIENT: .37 minutes.   POSSIBLE D/C IN 2 DAYS, DEPENDING ON CLINICAL CONDITION.   Shaune Pollackhen, Carlye Panameno M.D on 04/02/2015 at 3:05 PM  Between 7am to 6pm - Pager - [phone]  After 6pm go to [email or web address] - password [email or web address] [email or web address]  [facility]  Office  [phone]  CC: Primary care physician; No PCP Per Patient

## 2015-04-02 NOTE — Progress Notes (Signed)
Hr up to 151, strip sent down from CCU and Dr. Imogene Burnhen notified. NO new orders at this time

## 2015-04-02 NOTE — Plan of Care (Signed)
Problem: Discharge Progression Outcomes Goal: Other Discharge Outcomes/Goals Outcome: Progressing Pt is alert and oriented, no c/o of pain. Independent in room, c/o dyspnea on exertion, encouraged to call for assistance when needed. Resting quietly, VSS, continue to assess

## 2015-04-02 NOTE — Plan of Care (Signed)
Problem: Discharge Progression Outcomes Goal: Other Discharge Outcomes/Goals Outcome: Progressing Pt alert and at baseline mental status. Expiratory wheezes heard during assessment and pt remains tachycardic with exertion. Remains on tele, rounding dr states 1 more night to observe  Hr and respiratory status.

## 2015-04-03 LAB — GLUCOSE, CAPILLARY
Glucose-Capillary: 100 mg/dL — ABNORMAL HIGH (ref 65–99)
Glucose-Capillary: 127 mg/dL — ABNORMAL HIGH (ref 65–99)

## 2015-04-03 MED ORDER — BENZONATATE 200 MG PO CAPS: 200.0000 mg | ORAL_CAPSULE | Freq: Three times a day (TID) | ORAL | Status: DC | PRN

## 2015-04-03 NOTE — Progress Notes (Signed)
[facility] - [place] at [facility]        Masiel Monna FamWinstead was admitted to the Hospital on 04/01/2015 and Discharged  04/03/2015 and should be excused from work/school   for 7  days starting 04/01/2015 , may return to work/school without any restrictions.  Shaune PollackQing Dicky Boer MD.  Shaune Pollackhen, Airlie Blumenberg M.D on 04/03/2015,at 11:57 AM  [facility] [place] at [facility]    Office  [phone]

## 2015-04-03 NOTE — Plan of Care (Signed)
Problem: Discharge Progression Outcomes Goal: Other Discharge Outcomes/Goals Outcome: Progressing Individualization:  Pt prefers to be called Jessica Humphrey who lives at home.  Hx Asthma & HTN controlled by home medications.  Moderate fall risk. Refuses bed alarm. Steady gait, safely ambulates independently.   Plan of care progress to goals: 1. No c/o pain, resting comfortably throughout the night  2. Hemodynamically:             -VSS, afebrile              -Sinus Tachycardia on telemetry 100-110 overnight  3. Tolerating heart healthy diet  4. Moderate fall risk. Refuses bed alarm. Steady gait, safely ambulates independently.

## 2015-04-03 NOTE — Care Management (Signed)
Admitted to [facility] with the diagnosis of asthma. States she does not have a primary care physician. States [facility] Will [facility] application. Will update Lelon MastLorrie Holt at the [facility]. States that she works at [employer], but [employer] Homes[employer]'t have insurance.  Gwenette GreetBrenda S Lisbeth Puller RN MSN Care Management [phone]

## 2015-04-03 NOTE — Discharge Summary (Signed)
[facility] [place] [facility]   PATIENT NAME: Jessica SchlichterJeanette Humphrey    MR#:  [id number]  DATE OF BIRTH:  1985-10-10  DATE OF ADMISSION:  04/01/2015 ADMITTING PHYSICIAN: Arnaldo NatalMichael S Diamond, MD  DATE OF DISCHARGE: 04/03/2015  1:30 PM  PRIMARY CARE PHYSICIAN: No PCP Per Patient    ADMISSION DIAGNOSIS:  Asthma exacerbation [J45.901]   DISCHARGE DIAGNOSIS:    SECONDARY DIAGNOSIS:   Past Medical History  Diagnosis Date  . Asthma   . Hypertension     HOSPITAL COURSE:   1. Asthma exacerbation:  She has been treated with xopenex, dulera and prednisone. Her symptoms have not improved.  2. Community-acquired Pneumonia with leukocytosis; treated with levaquin and follow up CBC as outpatient. 3. Essential hypertension: Continue chlorothiazide and lisinopril 4. Obesity: BMI is 35.2; encouraged healthy diet and exercise    * tachycardia, due to Asthma exacerbation. HR was 120-150, down to about 100 today.   DISCHARGE CONDITIONS:    Stable, discharged to home today. CONSULTS OBTAINED:     DRUG ALLERGIES:  No Known Allergies  DISCHARGE MEDICATIONS:   Discharge Medication List as of 04/03/2015 12:17 PM    START taking these medications   Details  benzonatate (TESSALON) 200 MG capsule Take 1 capsule (200 mg total) by mouth 3 (three) times daily as needed for cough., Starting 04/03/2015, Until Discontinued, Print      CONTINUE these medications which have NOT CHANGED   Details  albuterol (PROVENTIL HFA;VENTOLIN HFA) 108 (90 BASE) MCG/ACT inhaler Inhale 2 puffs into the lungs every 6 (six) hours as needed for wheezing or shortness of breath., Starting 03/31/2015, Until Discontinued, Print    levofloxacin (LEVAQUIN) 750 MG tablet Take 1 tablet (750 mg total) by mouth daily., Starting 03/31/2015, Until Mon 04/20/15, Print    lisinopril-hydrochlorothiazide (PRINZIDE,ZESTORETIC) 20-25 MG tablet Take 1 tablet by mouth daily., Until Discontinued,  Historical Med    predniSONE (DELTASONE) 20 MG tablet Take 2 tablets (40 mg total) by mouth daily., Starting 03/31/2015, Until Wed 03/30/16, Print         DISCHARGE INSTRUCTIONS:   If you experience worsening of your admission symptoms, develop shortness of breath, life threatening emergency, suicidal or homicidal thoughts you must seek medical attention immediately by calling 911 or calling your MD immediately  if symptoms less severe.  You Must read complete instructions/literature along with all the possible adverse reactions/side effects for all the Medicines you take and that have been prescribed to you. Take any new Medicines after you have completely understood and accept all the possible adverse reactions/side effects.   Please note  You were cared for by a hospitalist during your hospital stay. If you have any questions about your discharge medications or the care you received while you were in the hospital after you are discharged, you can call the unit and asked to speak with the hospitalist on call if the hospitalist that took care of you is not available. Once you are discharged, your primary care physician will handle any further medical issues. Please note that NO REFILLS for any discharge medications will be authorized once you are discharged, as it is imperative that you return to your primary care physician (or establish a relationship with a primary care physician if you do not have one) for your aftercare needs so that they can reassess your need for medications and monitor your lab values.    Today   SUBJECTIVE   Complaint.   VITAL SIGNS:  Blood  pressure 112/79, pulse 112, temperature 98.2 F (36.8 C), temperature source Oral, resp. rate 18, height  (1.575 m), weight 86.592 kg (190 lb 14.4 oz), last menstrual period 03/31/2015, SpO2 92 %.  I/O:   Intake/Output Summary (Last 24 hours) at 04/03/15 1426 Last data filed at 04/03/15 0800  Gross per 24 hour   Intake    240 ml  Output      0 ml  Net    240 ml    PHYSICAL EXAMINATION:  GENERAL:  29 y.o.-year-old patient lying in the bed with no acute distress.  Obese. EYES: Pupils equal, round, reactive to light and accommodation. No scleral icterus. Extraocular muscles intact.  HEENT: Head atraumatic, normocephalic. Oropharynx and nasopharynx clear.  NECK:  Supple, no jugular venous distention. No thyroid enlargement, no tenderness.  LUNGS: Normal breath sounds bilaterally, no wheezing, rales,rhonchi or crepitation. No use of accessory muscles of respiration.  CARDIOVASCULAR: S1, S2 normal. No murmurs, rubs, or gallops.  ABDOMEN: Soft, non-tender, non-distended. Bowel sounds present. No organomegaly or mass.  EXTREMITIES: No pedal edema, cyanosis, or clubbing.  NEUROLOGIC: Cranial nerves II through XII are intact. Muscle strength 5/5 in all extremities. Sensation intact. Gait not checked.  PSYCHIATRIC: The patient is alert and oriented x 3.  SKIN: No obvious rash, lesion, or ulcer.   DATA REVIEW:   CBC  Recent Labs Lab 04/02/15 0501  WBC 14.3*  HGB 11.8*  HCT 36.5  PLT 368    Chemistries   Recent Labs Lab 03/31/15 2023  NA 138  K 3.7  CL 109  CO2 22  GLUCOSE 166*  BUN 8  CREATININE 0.59  CALCIUM 9.2    Cardiac Enzymes No results for input(s): TROPONINI in the last 168 hours.  Microbiology Results  Results for orders placed or performed during the hospital encounter of 03/31/15  Influenza A&B Antigens [facility] only)     Status: None   Collection Time: 03/31/15  1:42 PM  Result Value Ref Range Status   Influenza A [facility]) NOT DETECTED  Final   Influenza B [facility]) NOT DETECTED  Final    RADIOLOGY:  No results found.      Management plans discussed with the patient, family and they are in agreement.  CODE STATUS:     Code Status Orders        Start     Ordered   04/01/15 0804  Full code   Continuous     04/01/15 0804      TOTAL TIME TAKING CARE OF  THIS PATIENT: 35 minutes.    Shaune Pollack M.D on 04/03/2015 at 2:26 PM  Between 7am to 6pm - Pager - [phone]  After 6pm go to [email or web address] - password [email or web address] [facility]  [facility]  Office  [phone]  CC: Primary care physician; No PCP Per Patient

## 2015-04-03 NOTE — Discharge Instructions (Signed)
Low sodium and heart healthy diet. °Activity as tolerated. °

## 2015-11-03 ENCOUNTER — Inpatient Hospital Stay
Admission: EM | Admit: 2015-11-03 | Discharge: 2015-11-05 | DRG: 202 | Disposition: A | Discharge status: Home / Self Care | Payer: Self-pay | Attending: Internal Medicine | Admitting: Internal Medicine

## 2015-11-03 ENCOUNTER — Encounter: Payer: Self-pay | Admitting: Internal Medicine

## 2015-11-03 ENCOUNTER — Emergency Department: Payer: Self-pay

## 2015-11-03 DIAGNOSIS — Z79899 Other long term (current) drug therapy: Secondary | ICD-10-CM

## 2015-11-03 DIAGNOSIS — R000 Tachycardia, unspecified: Secondary | ICD-10-CM | POA: Diagnosis present

## 2015-11-03 DIAGNOSIS — J9601 Acute respiratory failure with hypoxia: Secondary | ICD-10-CM | POA: Diagnosis present

## 2015-11-03 DIAGNOSIS — J209 Acute bronchitis, unspecified: Secondary | ICD-10-CM | POA: Diagnosis present

## 2015-11-03 DIAGNOSIS — J45901 Unspecified asthma with (acute) exacerbation: Principal | ICD-10-CM | POA: Diagnosis present

## 2015-11-03 DIAGNOSIS — Z87891 Personal history of nicotine dependence: Secondary | ICD-10-CM

## 2015-11-03 DIAGNOSIS — I10 Essential (primary) hypertension: Secondary | ICD-10-CM | POA: Diagnosis present

## 2015-11-03 DIAGNOSIS — R0602 Shortness of breath: Secondary | ICD-10-CM

## 2015-11-03 LAB — BASIC METABOLIC PANEL
Anion gap: 9 (ref 5–15)
BUN: 13 mg/dL (ref 6–20)
CHLORIDE: 105 mmol/L (ref 101–111)
CO2: 23 mmol/L (ref 22–32)
Calcium: 9.5 mg/dL (ref 8.9–10.3)
Creatinine, Ser: 0.7 mg/dL (ref 0.44–1.00)
GFR calc Af Amer: >60 mL/min (ref >60)
GFR calc non Af Amer: >60 mL/min (ref >60)
GLUCOSE: 134 mg/dL — AB (ref 65–99)
POTASSIUM: 3.7 mmol/L (ref 3.5–5.1)
Sodium: 137 mmol/L (ref 135–145)

## 2015-11-03 LAB — CBC
HCT: 36.4 % (ref 35.0–47.0)
HEMOGLOBIN: 12.1 g/dL (ref 12.0–16.0)
MCH: 26.9 pg (ref 26.0–34.0)
MCHC: 33.3 g/dL (ref 32.0–36.0)
MCV: 80.7 fL (ref 80.0–100.0)
Platelets: 457 10*3/uL — ABNORMAL HIGH (ref 150–440)
RBC: 4.51 MIL/uL (ref 3.80–5.20)
RDW: 16.7 % — ABNORMAL HIGH (ref 11.5–14.5)
WBC: 12.5 10*3/uL — ABNORMAL HIGH (ref 3.6–11.0)

## 2015-11-03 MED ORDER — IPRATROPIUM-ALBUTEROL 0.5-2.5 (3) MG/3ML IN SOLN
3.0000 mL | Freq: Once | RESPIRATORY_TRACT | Status: AC
  Administered 2015-11-03: 3 mL via RESPIRATORY_TRACT
  Filled 2015-11-03: qty 3

## 2015-11-03 MED ORDER — METHYLPREDNISOLONE SODIUM SUCC 125 MG IJ SOLR
INTRAMUSCULAR | Status: AC
  Administered 2015-11-03: 125 mg via INTRAMUSCULAR
  Filled 2015-11-03: qty 2

## 2015-11-03 MED ORDER — METHYLPREDNISOLONE SODIUM SUCC 125 MG IJ SOLR
125.0000 mg | Freq: Once | INTRAMUSCULAR | Status: AC
  Administered 2015-11-03: 125 mg via INTRAMUSCULAR
  Filled 2015-11-03: qty 2

## 2015-11-03 MED ORDER — ONDANSETRON 4 MG PO TBDP
4.0000 mg | ORAL_TABLET | Freq: Once | ORAL | Status: AC
  Administered 2015-11-03: 4 mg via ORAL
  Filled 2015-11-03: qty 1

## 2015-11-03 MED ORDER — MAGNESIUM SULFATE 2 GM/50ML IV SOLN
2.0000 g | Freq: Once | INTRAVENOUS | Status: AC
  Administered 2015-11-03: 2 g via INTRAVENOUS
  Filled 2015-11-03: qty 50

## 2015-11-03 NOTE — H&P (Signed)
[facility] History and Physical  Jessica Humphrey WUJ:[id number] DOB: Jul 21, 1985 DOA: 11/03/2015  Referring physician: Dr. Inocencio HomesGayle PCP: No PCP Per Patient   Chief Complaint: SOB with wheezing x 1 wk  HPI: Jessica DockerJeanette M Tillett is a 30 y.o. female hx of Htn, bronchial asthma presents with above c/o.  Patient presents with increasing shortness of breath with wheezing for the past 1 week which has  been worsening progressively. She has been using her inhalers more frequently but sx further progressed, hence came to ed for further evaluation. Denies any fever, chills. Does complain of cough with expectation of yellow sputum. Denies any chest pain, dizziness, abdominal pain, vomiting, diarrhea, dysuria. Does complain of mild nausea.  Evaluation in the ED revealed stable vital signs and the patient was tachycardic and mildly hypoxic and physical exam revealed bilateral extensive wheezing. Patient was given DuoNeb's, O2 supplementation, IV Solu-Medrol and magnesium. Shortness of breath improved slightly but continued to have significant wheezing. Laboratory w/u revealed WBC of 12.5. Rest of CBC and BMP unremarkable. Chest x-ray reported negative for acute cardiopulmonary disease. EKG was sinus tachycardia with ventricular rate of 132 beats per minute. Hospitalist service was consulted for further management.  At the current time, pt is resting in bed comfortably, does not appear to be in acute distress, states feeling better.     Past Medical History  Diagnosis Date  . Asthma   . Hypertension     Past Surgical History  Procedure Laterality Date  . Cesarean section      Family History  Problem Relation Age of Onset  .       Social History  reports that she has quit smoking. She has never used smokeless tobacco. She reports that she does not drink alcohol. Her drug history is not on file.  No Known Allergies  Prior to Admission medications   Medication Sig Start Date End Date  Taking? Authorizing Provider  albuterol (PROVENTIL HFA;VENTOLIN HFA) 108 (90 BASE) MCG/ACT inhaler Inhale 2 puffs into the lungs every 6 (six) hours as needed for wheezing or shortness of breath. 03/31/15   Phineas SemenGraydon Goodman, MD  benzonatate (TESSALON) 200 MG capsule Take 1 capsule (200 mg total) by mouth 3 (three) times daily as needed for cough. 04/03/15   Shaune PollackQing Chen, MD  lisinopril-hydrochlorothiazide (PRINZIDE,ZESTORETIC) 20-25 MG tablet Take 1 tablet by mouth daily.    Historical Provider, MD  predniSONE (DELTASONE) 20 MG tablet Take 2 tablets (40 mg total) by mouth daily. 03/31/15 03/30/16  Phineas SemenGraydon Goodman, MD     Review of Systems:  Constitutional: No fevers, chills, fatigue, weakness.  Eyes: No blurred or double vision, pain, redness, inflammation. ENT: No tinnitus, ear pain, hearing loss, epistaxis, discharge, redness of oropharynx, difficulty swallowing. Resp: Cough +, wheeze +,  No hemoptysis, no painful respiration. Cardio-vascular: No chest pain, orthopnea, edema, DOE, palpitations, syncope. GI: Nausea +. No vomiting, diarrhea, abdominal pain, hematemesis, melena, GERD, rectal bleeding, constipation. GU: No dysuria, hematuria, urgency, frequency, incontinence Endocrine: No polyuria, nocturia, heat or cold intolerance, thirst Hematologic/Lymphatic: No anemia, easy bruising bleeding, swollen glands Integumentary: No acne, rash, lesions. Musculoskeletal: No pain in: neck-back-shoulder-knee-hip, arthritis, gout, redness. Neuro: No numbness, weakness, dysarthria, epilepsy, tremor, vertigo, ataxia, dementia, headache, migraine, CVA, TIA, seizure, memory loss Psych: No anxiety, insomnia, ADD, OCD, bipolar, depression, schizophrenia.   Physical Exam: Constitutional: Filed Vitals:   11/03/15 1917 11/03/15 2004 11/03/15 2131 11/03/15 2230  BP:   189/107 163/84  Pulse: 143 112 146 126  Temp:  99.3 F (37.4 C)  TempSrc:    Oral  Resp: 20  22 22   Height:      Weight:      SpO2: 95%   93% 91%   Wt Readings from Last 3 Encounters:  11/03/15 94.348 kg (208 lb)  04/03/15 86.592 kg (190 lb 14.4 oz)  03/31/15 87.091 kg (192 lb)   General:  Well developed, well nourished, obese, cachectic, in no acute distress HEENT: PERRL, EOMI, no scleral icterus, no conjunctivitis, no difficulty hearing, no pharyngeal erythema, Mucosa - moist. Neck:  Supple, no masses, non tender, no adenopathy, no JVD, no Carotid bruit, no thyromegaly,FROM Cardiovascular: Tachycardia +.  RRR, no m/r/g, no S3/S4, chest wall nontender, good pedal pulses, good femoral pulses, no LE edema. Respiratory: B/L expiratory wheezing +, breathing not labored, no increased respiratory effort. Abdomen: soft, nontender, nondistended, no mass, bowel sounds present and normoactive, no hepatosplenomegaly. GU/Gyn: Not examined. Musculoskeletal: 5/5 muscular strength x4 extremities, no cyanosis/clubbing. Skin: no rash, no lesions, no erythema. Lymphatic: No adenopathy (cervical/axilla/inguinal/supraclavicular) Neurologic: Cranial nerves intact, DTR intact, Sensation intact, Babinski sign R/L, no dysarthria, no aphasia, no dysphagia, no contractures Psychiatric: Alert, Oriented (time, person, place, circumstance), Cooperative, Judgement good, Memory intact, not confused, not agitated, not depressed, cooperative         Labs on Admission:  Basic Metabolic Panel:  Recent Labs Lab 11/03/15 2138  NA 137  K 3.7  CL 105  CO2 23  GLUCOSE 134*  BUN 13  CREATININE 0.70  CALCIUM 9.5   Liver Function Tests: No results for input(s): AST, ALT, ALKPHOS, BILITOT, PROT, ALBUMIN in the last 168 hours. No results for input(s): LIPASE, AMYLASE in the last 168 hours. No results for input(s): AMMONIA in the last 168 hours. CBC:  Recent Labs Lab 11/03/15 2138  WBC 12.5*  HGB 12.1  HCT 36.4  MCV 80.7  PLT 457*   Cardiac Enzymes: No results for input(s): CKTOTAL, CKMB, CKMBINDEX, TROPONINI in the last 168 hours. BNP (last  3 results) No results for input(s): BNP in the last 8760 hours.  ProBNP (last 3 results) No results for input(s): PROBNP in the last 8760 hours.  CBG: No results for input(s): GLUCAP in the last 168 hours.  Radiological Exams on Admission: Dg Chest 2 View  11/03/2015  CLINICAL DATA:  Shortness of breath since last week. Patient has a history of asthma. EXAM: CHEST  2 VIEW COMPARISON:  March 31 2015 FINDINGS: The heart size and mediastinal contours are within normal limits. There is no focal infiltrate, pulmonary edema, or pleural effusion. The visualized skeletal structures are unremarkable. IMPRESSION: No active cardiopulmonary disease. Electronically Signed   By: Sherian Rein M.D.   On: 11/03/2015 19:48    EKG: Independently reviewed. Sinus tachycardia with VR 132. No acute ischemic changes  Assessment/Plan Principal Problem:   Asthma exacerbation Active Problems:   Acute respiratory failure with hypoxia (HCC)   HTN (hypertension), benign  30 year old female with history of asthma and hypertension presents with complaints of ongoing shortness of breath with wheezing of one week duration.  1. Bronchial asthma exacerbation, likely 2/2 acute bronchitis. Associated cough with expectoration +. CXR no acute cardio-pulmonary pathology.  Plan : Admit to telemetry. Continue o2 supplementation, f/u o2 sats. Solumedrol 40 mg iv q 6 hrs. Albuterol nebs q 4-6 hrs prn. Start azithromycin 500 mg iv q daily for possible acute bronchitis. F/U cbc, bmp. 2. Acute respiratory failure with hypoxia 2/2 bronchial asthma exacerbation. Care as above.  3.  Htn, stable clinically. Continue home meds, f/u bp closely.   Code Status: Full code. Code Status History    Date Active Date Inactive Code Status Order ID Comments User Context   04/01/2015  8:04 AM 04/03/2015  4:40 PM Full Code [id number]  Arnaldo Natal, MD ED     DVT Prophylaxis: SubQ lovenox.  Time spent on admission: 45 minutes.  [facility]

## 2015-11-03 NOTE — ED Notes (Addendum)
Pt in with co shob since last week hx of asthma has used inhalers at home without relief.  Pt with slight shob noted in triage. Pt has hx of htn and has not taken meds in 2 days.

## 2015-11-03 NOTE — ED Provider Notes (Signed)
CSN: [id number]     Arrival date & time 11/03/15  1853 History   First MD Initiated Contact with Patient 11/03/15 1929     Chief Complaint  Patient presents with  . Asthma     (Consider location/radiation/quality/duration/timing/severity/associated sxs/prior Treatment) HPI  30 year old female with a history of asthma presents to the emergency department for shortness of breath, chest tightness and failure to respond to home medications. Patient states that last few days she's had increased shortness of breath and chest tightness with wheezing. She has been using albuterol nebulizers and today was using them every 2 hours without relief. Severity of symptoms is 8 out of 10. She feels that she is working hard to breathe. She denies any fevers, productive cough, cold symptoms, rashes, neck pain, abdominal pain. She describes her chest pain as tightness. She states she has a history of hypertension that has been without her medications last 2 days. No history of diabetes.  Past Medical History  Diagnosis Date  . Asthma   . Hypertension    Past Surgical History  Procedure Laterality Date  . Cesarean section     Family History  Problem Relation Age of Onset  .      Social History  Substance Use Topics  . Smoking status: Former Games developermoker  . Smokeless tobacco: Never Used  . Alcohol Use: No   OB History    No data available     Review of Systems  Constitutional: Negative for fever, chills, activity change and fatigue.  HENT: Negative for congestion, sinus pressure and sore throat.   Eyes: Negative for visual disturbance.  Respiratory: Positive for shortness of breath and wheezing. Negative for cough and chest tightness.   Cardiovascular: Negative for chest pain and leg swelling.  Gastrointestinal: Negative for nausea, vomiting, abdominal pain and diarrhea.  Genitourinary: Negative for dysuria.  Musculoskeletal: Negative for arthralgias and gait problem.  Skin: Negative for rash.   Neurological: Negative for weakness, numbness and headaches.  Hematological: Negative for adenopathy.  Psychiatric/Behavioral: Negative for behavioral problems, confusion and agitation.      Allergies  Review of patient's allergies indicates no known allergies.  Home Medications   Prior to Admission medications   Medication Sig Start Date End Date Taking? Authorizing Provider  albuterol (PROVENTIL HFA;VENTOLIN HFA) 108 (90 BASE) MCG/ACT inhaler Inhale 2 puffs into the lungs every 6 (six) hours as needed for wheezing or shortness of breath. 03/31/15   Phineas SemenGraydon Goodman, MD  benzonatate (TESSALON) 200 MG capsule Take 1 capsule (200 mg total) by mouth 3 (three) times daily as needed for cough. 04/03/15   Shaune PollackQing Chen, MD  lisinopril-hydrochlorothiazide (PRINZIDE,ZESTORETIC) 20-25 MG tablet Take 1 tablet by mouth daily.    Historical Provider, MD  predniSONE (DELTASONE) 20 MG tablet Take 2 tablets (40 mg total) by mouth daily. 03/31/15 03/30/16  Phineas SemenGraydon Goodman, MD   BP 163/84 mmHg  Pulse 126  Temp(Src) 99.3 F (37.4 C) (Oral)  Resp 22  Ht 5\' 3"  (1.6 m)  Wt 94.348 kg  BMI 36.85 kg/m2  SpO2 91%  LMP 10/04/2015 Physical Exam  Constitutional: She is oriented to person, place, and time. She appears well-developed and well-nourished. No distress.  HENT:  Head: Normocephalic and atraumatic.  Mouth/Throat: Oropharynx is clear and moist.  Eyes: EOM are normal. Pupils are equal, round, and reactive to light. Right eye exhibits no discharge. Left eye exhibits no discharge.  Neck: Normal range of motion. Neck supple.  Cardiovascular: Normal rate, regular rhythm and intact distal  pulses.   Pulmonary/Chest: Accessory muscle usage present. Tachypnea noted. No respiratory distress. She has wheezes. She exhibits no tenderness.  Diffuse inspiratory and expiratory wheezing with decreased air movement and accessory muscle use.  Abdominal: Soft. She exhibits no distension and no mass. There is no  tenderness. There is no rebound and no guarding.  Musculoskeletal: Normal range of motion. She exhibits no edema.  Neurological: She is alert and oriented to person, place, and time. She has normal reflexes.  Skin: Skin is warm and dry.  Psychiatric: She has a normal mood and affect. Her behavior is normal. Thought content normal.    ED Course  Procedures (including critical care time) Labs Review Labs Reviewed  CBC - Abnormal; Notable for the following:    WBC 12.5 (*)    RDW 16.7 (*)    Platelets 457 (*)    All other components within normal limits  BASIC METABOLIC PANEL - Abnormal; Notable for the following:    Glucose, Bld 134 (*)    All other components within normal limits    Imaging Review Dg Chest 2 View  11/03/2015  CLINICAL DATA:  Shortness of breath since last week. Patient has a history of asthma. EXAM: CHEST  2 VIEW COMPARISON:  March 31 2015 FINDINGS: The heart size and mediastinal contours are within normal limits. There is no focal infiltrate, pulmonary edema, or pleural effusion. The visualized skeletal structures are unremarkable. IMPRESSION: No active cardiopulmonary disease. Electronically Signed   By: Sherian Rein M.D.   On: 11/03/2015 19:48   I have personally reviewed and evaluated these images and lab results as part of my medical decision-making.   EKG Interpretation None      MDM   Final diagnoses:  Asthma exacerbation  Shortness of breath    30 year old female with asthma exacerbation and shortness of breath not responding to home albuterol nebulizers. Upon arrival O2 sats at 95%. After 2 DuoNeb treatments, 125 mg of Solu-Medrol and 2 g of magnesium patients wheezing did improve but she continued to have accessory muscle use and O2 sats rations around 91%. Discussed admission with hospitalist. Patient will be admitted to the hospital.    Evon Slack, PA-C 11/03/15 2257  Gayla Doss, MD 11/03/15 2322

## 2015-11-03 NOTE — ED Provider Notes (Signed)
ED ECG REPORT I, Gayla DossGayle, Teona Vargus A, the attending physician, personally viewed and interpreted this ECG.   Date: 11/03/2015  EKG Time: 22:01  Rate: 132  Rhythm: sinus tachycardia  Axis: normal  Intervals:none  ST&T Change: No acute ST elevation.   Gayla DossEryka A Elgin Carn, MD 11/03/15 2211

## 2015-11-04 ENCOUNTER — Encounter: Payer: Self-pay | Admitting: *Deleted

## 2015-11-04 LAB — BASIC METABOLIC PANEL
Anion gap: 9 (ref 5–15)
BUN: 9 mg/dL (ref 6–20)
CHLORIDE: 105 mmol/L (ref 101–111)
CO2: 23 mmol/L (ref 22–32)
CREATININE: 0.64 mg/dL (ref 0.44–1.00)
Calcium: 9.5 mg/dL (ref 8.9–10.3)
GFR calc Af Amer: >60 mL/min (ref >60)
Glucose, Bld: 173 mg/dL — ABNORMAL HIGH (ref 65–99)
Potassium: 4.2 mmol/L (ref 3.5–5.1)
SODIUM: 137 mmol/L (ref 135–145)

## 2015-11-04 LAB — CBC
HEMATOCRIT: 37.2 % (ref 35.0–47.0)
HEMOGLOBIN: 12 g/dL (ref 12.0–16.0)
MCH: 26.5 pg (ref 26.0–34.0)
MCHC: 32.3 g/dL (ref 32.0–36.0)
MCV: 82 fL (ref 80.0–100.0)
Platelets: 410 10*3/uL (ref 150–440)
RBC: 4.53 MIL/uL (ref 3.80–5.20)
RDW: 16.7 % — ABNORMAL HIGH (ref 11.5–14.5)
WBC: 11.9 10*3/uL — AB (ref 3.6–11.0)

## 2015-11-04 MED ORDER — ACETAMINOPHEN 325 MG PO TABS
650.0000 mg | ORAL_TABLET | Freq: Four times a day (QID) | ORAL | Status: DC | PRN
Start: 2015-11-04 — End: 2015-11-05
  Administered 2015-11-04 – 2015-11-05 (×2): 650 mg via ORAL
  Filled 2015-11-04 (×3): qty 2

## 2015-11-04 MED ORDER — ACETAMINOPHEN 325 MG RE SUPP: 650.0000 mg | Freq: Four times a day (QID) | RECTAL | Status: DC | PRN

## 2015-11-04 MED ORDER — ONDANSETRON HCL 4 MG PO TABS: 4.0000 mg | ORAL_TABLET | Freq: Four times a day (QID) | ORAL | Status: DC | PRN

## 2015-11-04 MED ORDER — LISINOPRIL-HYDROCHLOROTHIAZIDE 20-25 MG PO TABS: 1.0000 | ORAL_TABLET | Freq: Every day | ORAL | Status: DC

## 2015-11-04 MED ORDER — ENOXAPARIN SODIUM 40 MG/0.4ML ~~LOC~~ SOLN
40.0000 mg | SUBCUTANEOUS | Status: DC
  Administered 2015-11-04: 40 mg via SUBCUTANEOUS
  Filled 2015-11-04: qty 0.4

## 2015-11-04 MED ORDER — DEXTROSE 5 % IV SOLN
500.0000 mg | INTRAVENOUS | Status: DC
  Administered 2015-11-04: 500 mg via INTRAVENOUS
  Filled 2015-11-04 (×2): qty 500

## 2015-11-04 MED ORDER — LEVALBUTEROL HCL 1.25 MG/0.5ML IN NEBU
1.2500 mg | INHALATION_SOLUTION | Freq: Four times a day (QID) | RESPIRATORY_TRACT | Status: DC
  Administered 2015-11-04 – 2015-11-05 (×6): 1.25 mg via RESPIRATORY_TRACT
  Filled 2015-11-04 (×6): qty 0.5

## 2015-11-04 MED ORDER — LISINOPRIL 10 MG PO TABS
20.0000 mg | ORAL_TABLET | Freq: Every day | ORAL | Status: DC
  Administered 2015-11-04 – 2015-11-05 (×2): 20 mg via ORAL
  Filled 2015-11-04 (×2): qty 2

## 2015-11-04 MED ORDER — BENZONATATE 100 MG PO CAPS
200.0000 mg | ORAL_CAPSULE | Freq: Three times a day (TID) | ORAL | Status: DC | PRN
  Administered 2015-11-04 – 2015-11-05 (×2): 200 mg via ORAL
  Filled 2015-11-04 (×2): qty 2

## 2015-11-04 MED ORDER — METHYLPREDNISOLONE SODIUM SUCC 40 MG IJ SOLR
40.0000 mg | Freq: Four times a day (QID) | INTRAMUSCULAR | Status: DC
  Administered 2015-11-04 (×2): 40 mg via INTRAVENOUS
  Filled 2015-11-04 (×2): qty 1

## 2015-11-04 MED ORDER — METHYLPREDNISOLONE SODIUM SUCC 40 MG IJ SOLR
40.0000 mg | Freq: Two times a day (BID) | INTRAMUSCULAR | Status: DC
  Administered 2015-11-04 – 2015-11-05 (×2): 40 mg via INTRAVENOUS
  Filled 2015-11-04 (×2): qty 1

## 2015-11-04 MED ORDER — ONDANSETRON HCL 4 MG/2ML IJ SOLN: 4.0000 mg | Freq: Four times a day (QID) | INTRAMUSCULAR | Status: DC | PRN

## 2015-11-04 MED ORDER — AZITHROMYCIN 250 MG PO TABS
500.0000 mg | ORAL_TABLET | Freq: Every day | ORAL | Status: DC
  Administered 2015-11-05: 500 mg via ORAL
  Filled 2015-11-04: qty 2

## 2015-11-04 MED ORDER — HYDROCHLOROTHIAZIDE 25 MG PO TABS
25.0000 mg | ORAL_TABLET | Freq: Every day | ORAL | Status: DC
  Administered 2015-11-04 – 2015-11-05 (×2): 25 mg via ORAL
  Filled 2015-11-04 (×2): qty 1

## 2015-11-04 MED ORDER — ALBUTEROL SULFATE (2.5 MG/3ML) 0.083% IN NEBU: 2.5000 mg | INHALATION_SOLUTION | RESPIRATORY_TRACT | Status: DC | PRN

## 2015-11-04 MED ORDER — GUAIFENESIN-DM 100-10 MG/5ML PO SYRP
5.0000 mL | ORAL_SOLUTION | ORAL | Status: DC | PRN
  Administered 2015-11-04 – 2015-11-05 (×3): 5 mL via ORAL
  Filled 2015-11-04 (×3): qty 5

## 2015-11-04 MED ORDER — SODIUM CHLORIDE 0.9% FLUSH
3.0000 mL | Freq: Two times a day (BID) | INTRAVENOUS | Status: DC
  Administered 2015-11-04 (×3): 3 mL via INTRAVENOUS

## 2015-11-04 NOTE — Care Management (Signed)
Patient with no insurance. Not home bound. States she has been going to urgent care for medical needs. Application given [facility]. Email sent to Fonda KinderLori Holt for follow up. Patient independent, active, works and drives. No needs identified otherwise.

## 2015-11-04 NOTE — ED Notes (Signed)
Pt transported to room 247 

## 2015-11-04 NOTE — Progress Notes (Signed)
[facility] [place] [facility]   PATIENT NAME: Jessica SchlichterJeanette Humphrey    MR#:  [id number]  DATE OF BIRTH:  1985-09-24  SUBJECTIVE:   Patient teary this morning SOB has improved still with wheezing feels better then yesterday  REVIEW OF SYSTEMS:    Review of Systems  Constitutional: Negative for fever, chills and malaise/fatigue.  HENT: Negative for ear discharge, ear pain, hearing loss, nosebleeds and sore throat.   Eyes: Negative for blurred vision and pain.  Respiratory: Positive for shortness of breath and wheezing. Negative for cough and hemoptysis.   Cardiovascular: Negative for chest pain, palpitations and leg swelling.  Gastrointestinal: Negative for nausea, vomiting, abdominal pain, diarrhea and blood in stool.  Genitourinary: Negative for dysuria.  Musculoskeletal: Negative for back pain.  Neurological: Negative for dizziness, tremors, speech change, focal weakness, seizures and headaches.  Endo/Heme/Allergies: Does not bruise/bleed easily.  Psychiatric/Behavioral: Negative for depression, suicidal ideas and hallucinations.    Tolerating Diet: yes      DRUG ALLERGIES:  No Known Allergies  VITALS:  Blood pressure 144/85, pulse 107, temperature 98.5 F (36.9 C), temperature source Oral, resp. rate 16, height 5\' 2"  (1.575 m), weight 95.482 kg (210 lb 8 oz), last menstrual period 10/04/2015, SpO2 96 %.  PHYSICAL EXAMINATION:   Physical Exam  Constitutional: She is oriented to person, place, and time and well-developed, well-nourished, and in no distress. No distress.  HENT:  Head: Normocephalic.  Eyes: No scleral icterus.  Neck: Normal range of motion. Neck supple. No JVD present. No tracheal deviation present.  Cardiovascular: Normal rate, regular rhythm and normal heart sounds.  Exam reveals no gallop and no friction rub.   No murmur heard. Pulmonary/Chest: Effort normal. No respiratory distress. She has wheezes. She has no rales. She exhibits  no tenderness.  Abdominal: Soft. Bowel sounds are normal. She exhibits no distension and no mass. There is no tenderness. There is no rebound and no guarding.  Musculoskeletal: Normal range of motion. She exhibits no edema.  Neurological: She is alert and oriented to person, place, and time.  Skin: Skin is warm. No rash noted. No erythema.  Psychiatric: Affect and judgment normal.      LABORATORY PANEL:   CBC  Recent Labs Lab 11/04/15 0508  WBC 11.9*  HGB 12.0  HCT 37.2  PLT 410   ------------------------------------------------------------------------------------------------------------------  Chemistries   Recent Labs Lab 11/04/15 0508  NA 137  K 4.2  CL 105  CO2 23  GLUCOSE 173*  BUN 9  CREATININE 0.64  CALCIUM 9.5   ------------------------------------------------------------------------------------------------------------------  Cardiac Enzymes No results for input(s): TROPONINI in the last 168 hours. ------------------------------------------------------------------------------------------------------------------  RADIOLOGY:  Dg Chest 2 View  11/03/2015  CLINICAL DATA:  Shortness of breath since last week. Patient has a history of asthma. EXAM: CHEST  2 VIEW COMPARISON:  March 31 2015 FINDINGS: The heart size and mediastinal contours are within normal limits. There is no focal infiltrate, pulmonary edema, or pleural effusion. The visualized skeletal structures are unremarkable. IMPRESSION: No active cardiopulmonary disease. Electronically Signed   By: Sherian ReinWei-Chen  Lin M.D.   On: 11/03/2015 19:48     ASSESSMENT AND PLAN:   30 year old female with history of asthma who presents with asthma exacerbation.  1. Acute hypoxic respiratory failure due to asthma exacerbation: Patient has been weaned off oxygen. Patient continues to have wheezing and feeling some shortness of breath.. Continue azithromycin for acute bronchitis. Change albuterol to Xopenex due to  tachycardia.   2. Asthma exacerbation:  Patient has improved since admission. Wean IV steroids and continue plan as outlined above  3. Acute bronchitis: Continue azithromycin.   4. Essential hypertension: Continue lisinopril and HCTZ.      Management plans discussed with the patient and she is in agreement.  CODE STATUS: FULL  TOTAL TIME TAKING CARE OF THIS PATIENT: 30 minutes.     POSSIBLE D/C tomorrow, DEPENDING ON CLINICAL CONDITION.   Compton Brigance M.D on 11/04/2015 at 12:34 PM  Between 7am to 6pm - Pager - [phone] After 6pm go to [email or web address] - Social research officer, government  [facility]  Office  [phone]  CC: Primary care physician; No PCP Per Patient  Note: This dictation was prepared with Dragon dictation along with smaller phrase technology. Any transcriptional errors that result from this process are unintentional.

## 2015-11-04 NOTE — Progress Notes (Signed)
Patients heart rate sustaining in 130-140. Occasionally since 9 am. Appears to be happening right after patient received breathing treatment and brought on by coughing. Dr. Juliene PinaMody notified no orders received.

## 2015-11-05 MED ORDER — HYDROCOD POLST-CPM POLST ER 10-8 MG/5ML PO SUER
5.0000 mL | Freq: Two times a day (BID) | ORAL | Status: DC | PRN
Start: 2015-11-05 — End: 2018-06-15

## 2015-11-05 MED ORDER — TIOTROPIUM BROMIDE MONOHYDRATE 18 MCG IN CAPS: 18.0000 ug | ORAL_CAPSULE | Freq: Every day | RESPIRATORY_TRACT | Status: DC

## 2015-11-05 MED ORDER — PREDNISONE 10 MG PO TABS: 10.0000 mg | ORAL_TABLET | Freq: Every day | ORAL | Status: DC

## 2015-11-05 MED ORDER — AZITHROMYCIN 250 MG PO TABS: ORAL_TABLET | ORAL | Status: DC

## 2015-11-05 NOTE — Clinical Documentation Improvement (Signed)
Internal Medicine   Can the "asthma" diagnosis be further specified by frequency?  Thank you   Frequency:   mild intermittent Persistent mild persistent moderate persistent severe persistent.   Other Condition Cannot Clinically Determine    Supporting Information: Acute Bronchitis, Respiratory Failure    Please exercise your independent, professional judgment when responding. A specific answer is not anticipated or expected.   Thank You, Lavonda JumboLawanda J Raevon [facility] [place] [phone]

## 2015-11-05 NOTE — Care Management Note (Signed)
Case Management Note  Patient Details  Name: Geraldo DockerJeanette M Steig MRN: [id number] Date of Birth: 1985/12/25  Subjective/Objective:            Provided Ms Monna FamWinstead with a list of local clinics that serve the uninsured, and applications to [facility]. Mrs Monna FamWinstead was very interested in going to [facility].  This Clinical research associatewriter strongly encouraged Ms Monna FamWinstead to call for an appointment at one of the local clinics to obtain a PCP for more consistent outpatient maintenance.        Action/Plan:   Expected Discharge Date:                  Expected Discharge Plan:     In-House Referral:     Discharge planning Services     Post Acute Care Choice:    Choice offered to:     DME Arranged:    DME Agency:     HH Arranged:    HH Agency:     Status of Service:     Medicare Important Message Given:    Date Medicare IM Given:    Medicare IM give by:    Date Additional Medicare IM Given:    Additional Medicare Important Message give by:     If discussed at Long Length of Stay Meetings, dates discussed:    Additional Comments:  Dalissa Lovin A, RN 11/05/2015, 9:20 AM

## 2015-11-05 NOTE — Discharge Summary (Signed)
[employer] [facility] [facility] [place] [facility]   PATIENT NAME: Jessica Humphrey    MR#:  [id number]  DATE OF BIRTH:  11/06/1985  DATE OF ADMISSION:  11/03/2015 ADMITTING PHYSICIAN: Crissie Figures, MD  DATE OF DISCHARGE: 11/05/2015  PRIMARY CARE PHYSICIAN: No PCP Per Patient    ADMISSION DIAGNOSIS:  Shortness of breath [R06.02] Asthma exacerbation [J45.901]  DISCHARGE DIAGNOSIS:  Principal Problem:   Asthma exacerbation Active Problems:   Acute respiratory failure with hypoxia (HCC)   HTN (hypertension), benign   SECONDARY DIAGNOSIS:   Past Medical History  Diagnosis Date  . Asthma   . Hypertension     HOSPITAL COURSE:   30 year old female with history of asthma who presents with asthma exacerbation.  1. Acute hypoxic respiratory failure due to asthma exacerbation: Patient has been weaned off oxygen.  She has some minimal wheezing on examination today. Shortness of breath is improved. She is ambulating without oxygen. She will be discharged with inhaler and prednisone taper along with antibiotics for [place]'s.  2. Asthma exacerbation: Patient has improved since admission. She will be discharged with oral antibiotics and oral steroid taper. 3. Acute bronchitis: Continue azithromycin.   4. Essential hypertension: Continue lisinopril and HCTZ   DISCHARGE CONDITIONS AND DIET:   Stable for discharge on heart healthy/regular diet  CONSULTS OBTAINED:     DRUG ALLERGIES:  No Known Allergies  DISCHARGE MEDICATIONS:   Current Discharge Medication List    START taking these medications   Details  azithromycin (ZITHROMAX) 250 MG tablet Take 500 mg daily for 6 days Qty: 6 each, Refills: 0    chlorpheniramine-HYDROcodone (TUSSIONEX PENNKINETIC ER) 10-8 MG/5ML SUER Take 5 mLs by mouth every 12 (twelve) hours as needed for cough. Qty: 115 mL, Refills: 0    predniSONE (DELTASONE) 10 MG tablet Take 1 tablet (10 mg total) by mouth daily with breakfast. 60 mg  PO (ORAL) x 2 days 50 mg PO (ORAL)  x 2 days 40 mg PO (ORAL)  x 2 days 30 mg PO  (ORAL)  x 2 days 20 mg PO  (ORAL) x 2 days 10 mg PO  (ORAL) x 2 days then stop Qty: 38 tablet, Refills: 0    tiotropium (SPIRIVA HANDIHALER) 18 MCG inhalation capsule Place 1 capsule (18 mcg total) into inhaler and inhale daily. Qty: 30 capsule, Refills: 12      CONTINUE these medications which have NOT CHANGED   Details  albuterol (PROVENTIL HFA;VENTOLIN HFA) 108 (90 BASE) MCG/ACT inhaler Inhale 2 puffs into the lungs every 6 (six) hours as needed for wheezing or shortness of breath. Qty: 1 Inhaler, Refills: 2    lisinopril-hydrochlorothiazide (PRINZIDE,ZESTORETIC) 20-25 MG tablet Take 1 tablet by mouth daily.      STOP taking these medications     benzonatate (TESSALON) 200 MG capsule               Today   CHIEF COMPLAINT:   Doing better this morning. Shortness of breath has improved.   VITAL SIGNS:  Blood pressure 126/88, pulse 99, temperature 98.3 F (36.8 C), temperature source Oral, resp. rate 18, height  (1.575 m), weight 93.713 kg (206 lb 9.6 oz), last menstrual period 10/04/2015, SpO2 97 %.   REVIEW OF SYSTEMS:  Review of Systems  Constitutional: Negative for fever, chills and malaise/fatigue.  HENT: Negative for ear discharge, ear pain, hearing loss, nosebleeds and sore throat.   Eyes: Negative for blurred vision and pain.  Respiratory: Positive for cough and  wheezing. Negative for hemoptysis and shortness of breath (better).   Cardiovascular: Negative for chest pain, palpitations and leg swelling.  Gastrointestinal: Negative for nausea, vomiting, abdominal pain, diarrhea and blood in stool.  Genitourinary: Negative for dysuria.  Musculoskeletal: Negative for back pain.  Neurological: Negative for dizziness, tremors, speech change, focal weakness, seizures and headaches.  Endo/Heme/Allergies: Does not bruise/bleed easily.  Psychiatric/Behavioral: Negative for  depression, suicidal ideas and hallucinations.     PHYSICAL EXAMINATION:  GENERAL:  30 y.o.-year-old patient lying in the bed with no acute distress.  NECK:  Supple, no jugular venous distention. No thyroid enlargement, no tenderness.  LUNGS: Minimal expiratory bilateral wheezing good air entry no rales,rhonchi  No use of accessory muscles of respiration.  CARDIOVASCULAR: S1, S2 normal. No murmurs, rubs, or gallops.  ABDOMEN: Soft, non-tender, non-distended. Bowel sounds present. No organomegaly or mass.  EXTREMITIES: No pedal edema, cyanosis, or clubbing.  PSYCHIATRIC: The patient is alert and oriented x 3.  SKIN: No obvious rash, lesion, or ulcer.   DATA REVIEW:   CBC  Recent Labs Lab 11/04/15 0508  WBC 11.9*  HGB 12.0  HCT 37.2  PLT 410    Chemistries   Recent Labs Lab 11/04/15 0508  NA 137  K 4.2  CL 105  CO2 23  GLUCOSE 173*  BUN 9  CREATININE 0.64  CALCIUM 9.5    Cardiac Enzymes No results for input(s): TROPONINI in the last 168 hours.  Microbiology Results  @MICRORSLT48 @  RADIOLOGY:  Dg Chest 2 View  11/03/2015  CLINICAL DATA:  Shortness of breath since last week. Patient has a history of asthma. EXAM: CHEST  2 VIEW COMPARISON:  March 31 2015 FINDINGS: The heart size and mediastinal contours are within normal limits. There is no focal infiltrate, pulmonary edema, or pleural effusion. The visualized skeletal structures are unremarkable. IMPRESSION: No active cardiopulmonary disease. Electronically Signed   By: Sherian ReinWei-Chen  Lin M.D.   On: 11/03/2015 19:48      Management plans discussed with the patient and she is in agreement. Stable for discharge home  Patient should follow up with PCP in 1 week  CODE STATUS:     Code Status Orders        Start     Ordered   11/04/15 0005  Full code   Continuous     11/04/15 0004    Code Status History    Date Active Date Inactive Code Status Order ID Comments User Context   04/01/2015  8:04 AM 04/03/2015   4:40 PM Full Code [id number]  Arnaldo NatalMichael S Diamond, MD ED      TOTAL TIME TAKING CARE OF THIS PATIENT: 35 minutes.    Note: This dictation was prepared with Dragon dictation along with smaller phrase technology. Any transcriptional errors that result from this process are unintentional.  Naveyah Iacovelli M.D on 11/05/2015 at 11:17 AM  Between 7am to 6pm - Pager - [phone] After 6pm go to [email or web address] - Social research officer, governmentpassword EPAS [email or web address]  [facility]  Office  [phone]  CC: Primary care physician; No PCP Per Patient

## 2015-11-12 ENCOUNTER — Ambulatory Visit: Payer: Self-pay

## 2015-12-24 ENCOUNTER — Ambulatory Visit: Payer: Self-pay

## 2016-04-04 ENCOUNTER — Ambulatory Visit: Payer: Self-pay

## 2017-10-20 ENCOUNTER — Other Ambulatory Visit: Payer: Self-pay | Admitting: Ophthalmology

## 2017-10-20 DIAGNOSIS — G4489 Other headache syndrome: Secondary | ICD-10-CM

## 2017-10-20 DIAGNOSIS — H4710 Unspecified papilledema: Secondary | ICD-10-CM

## 2017-10-30 ENCOUNTER — Ambulatory Visit
Admission: RE | Admit: 2017-10-30 | Discharge: 2017-10-30 | Disposition: A | Discharge status: Home / Self Care | Payer: MEDICAID | Source: Ambulatory Visit | Attending: Ophthalmology | Admitting: Ophthalmology

## 2017-10-30 DIAGNOSIS — H4710 Unspecified papilledema: Secondary | ICD-10-CM | POA: Insufficient documentation

## 2017-10-30 DIAGNOSIS — G4489 Other headache syndrome: Secondary | ICD-10-CM | POA: Insufficient documentation

## 2017-10-30 LAB — PREGNANCY, URINE: Preg Test, Ur: NEGATIVE

## 2017-10-30 NOTE — Progress Notes (Signed)
Patient's BP elevated-had not taken BP medication since 10/27/17.  Had medication with her-took own medication. Dr. Allena Katz notified and he came to speak with patient.  Rescheduled for 11/06/17.  Encouraged to take medication as directed and to notify her PCP.

## 2017-11-06 ENCOUNTER — Ambulatory Visit
Admission: RE | Admit: 2017-11-06 | Discharge: 2017-11-06 | Disposition: A | Discharge status: Home / Self Care | Payer: BLUE CROSS/BLUE SHIELD | Source: Ambulatory Visit | Attending: Ophthalmology | Admitting: Ophthalmology

## 2017-11-06 DIAGNOSIS — G4452 New daily persistent headache (NDPH): Secondary | ICD-10-CM | POA: Diagnosis not present

## 2017-11-06 DIAGNOSIS — H4710 Unspecified papilledema: Secondary | ICD-10-CM | POA: Insufficient documentation

## 2017-11-06 LAB — PROTEIN, CSF: TOTAL PROTEIN, CSF: 19 mg/dL (ref 15–45)

## 2017-11-06 LAB — CSF CELL COUNT WITH DIFFERENTIAL
EOS CSF: 0 %
LYMPHS CSF: 0 %
Monocyte-Macrophage-Spinal Fluid: 0 %
Other Cells, CSF: 0
RBC Count, CSF: 7 /mm3 — ABNORMAL HIGH (ref 0–3)
SEGMENTED NEUTROPHILS-CSF: 0 %
Tube #: 3
WBC CSF: 0 /mm3 (ref 0–5)

## 2017-11-06 MED ORDER — ACETAMINOPHEN 325 MG PO TABS
650.0000 mg | ORAL_TABLET | ORAL | Status: DC | PRN
  Filled 2017-11-06: qty 2

## 2017-11-06 MED ORDER — LIDOCAINE HCL (PF) 1 % IJ SOLN
5.0000 mL | Freq: Once | INTRAMUSCULAR | Status: AC
  Administered 2017-11-06: 5 mL
  Filled 2017-11-06: qty 5

## 2017-11-06 NOTE — Progress Notes (Signed)
Post-procedural instrucations reviewed with pt. With verbalized understanding. No IV, No sedation given for procedure. Also stressed to pt. To record BP for a week. Asked pt. If BP remains elevated to contact primary care MD. Pt. Verbalized understanding. Stable for DC home. Posterior lumbar puncture site clean, dry, intact without complications at site.

## 2017-11-09 LAB — CSF CULTURE: CULTURE: NO GROWTH

## 2017-11-09 LAB — CSF CULTURE W GRAM STAIN

## 2017-12-08 LAB — PATHOLOGIST SMEAR REVIEW

## 2018-01-18 LAB — HM PAP SMEAR: HM Pap smear: NORMAL

## 2018-06-15 ENCOUNTER — Encounter: Payer: Self-pay | Admitting: Gynecology

## 2018-06-15 ENCOUNTER — Ambulatory Visit (INDEPENDENT_AMBULATORY_CARE_PROVIDER_SITE_OTHER): Payer: BLUE CROSS/BLUE SHIELD

## 2018-06-15 ENCOUNTER — Other Ambulatory Visit: Payer: Self-pay

## 2018-06-15 ENCOUNTER — Ambulatory Visit
Admission: EM | Admit: 2018-06-15 | Discharge: 2018-06-15 | Disposition: A | Discharge status: Home / Self Care | Payer: BLUE CROSS/BLUE SHIELD | Attending: Family Medicine | Admitting: Family Medicine

## 2018-06-15 DIAGNOSIS — R05 Cough: Secondary | ICD-10-CM

## 2018-06-15 DIAGNOSIS — M7918 Myalgia, other site: Secondary | ICD-10-CM

## 2018-06-15 DIAGNOSIS — J09X2 Influenza due to identified novel influenza A virus with other respiratory manifestations: Secondary | ICD-10-CM | POA: Diagnosis not present

## 2018-06-15 DIAGNOSIS — R509 Fever, unspecified: Secondary | ICD-10-CM

## 2018-06-15 LAB — RAPID INFLUENZA A&B ANTIGENS: Influenza A (ARMC): NEGATIVE

## 2018-06-15 LAB — RAPID INFLUENZA A&B ANTIGENS (ARMC ONLY): INFLUENZA B (ARMC): POSITIVE — AB

## 2018-06-15 MED ORDER — ACETAMINOPHEN 500 MG PO TABS
1000.0000 mg | ORAL_TABLET | Freq: Once | ORAL | Status: AC
  Administered 2018-06-15: 1000 mg via ORAL

## 2018-06-15 MED ORDER — OSELTAMIVIR PHOSPHATE 75 MG PO CAPS: 75.0000 mg | ORAL_CAPSULE | Freq: Two times a day (BID) | ORAL | 0 refills | Status: DC

## 2018-06-15 MED ORDER — BENZONATATE 100 MG PO CAPS: 100.0000 mg | ORAL_CAPSULE | Freq: Three times a day (TID) | ORAL | 0 refills | Status: DC | PRN

## 2018-06-15 MED ORDER — PREDNISONE 20 MG PO TABS: 40.0000 mg | ORAL_TABLET | Freq: Every day | ORAL | 0 refills | Status: AC

## 2018-06-15 MED ORDER — AZITHROMYCIN 250 MG PO TABS: 250.0000 mg | ORAL_TABLET | Freq: Every day | ORAL | 0 refills | Status: DC

## 2018-06-15 NOTE — ED Provider Notes (Addendum)
[facility]    CSN: [id number] Arrival date & time: 06/15/18  1656     History   Chief Complaint Chief Complaint  Patient presents with  . Cough    HPI Jessica Humphrey is a 32 y.o. female.   Subjective:  Jessica Humphrey is a 32 y.o. female who presents for evaluation of symptoms of a URI. Symptoms include chest congestion, productive cough, shortness of breath and wheezing. Onset of symptoms was 2 weeks ago, gradually worsening since that time. Associated symptoms include achiness, congestion, fever-duration 2  days and rhinorrhea.  She is drinking plenty of fluids. Evaluation to date: none. Treatment to date: Albuterol inhaler and OTC agents with minimial improvement in symptoms.  The following portions of the patient's history were reviewed and updated as appropriate: allergies, current medications, past family history, past medical history, past social history, past surgical history and problem list.        Past Medical History:  Diagnosis Date  . Asthma   . Hypertension     Patient Active Problem List   Diagnosis Date Noted  . Acute respiratory failure with hypoxia (HCC) 11/03/2015  . HTN (hypertension), benign 11/03/2015  . Asthma exacerbation 04/01/2015    Past Surgical History:  Procedure Laterality Date  . CESAREAN SECTION      OB History   No obstetric history on file.      Home Medications    Prior to Admission medications   Medication Sig Start Date End Date Taking? Authorizing Provider  albuterol (PROVENTIL HFA;VENTOLIN HFA) 108 (90 BASE) MCG/ACT inhaler Inhale 2 puffs into the lungs every 6 (six) hours as needed for wheezing or shortness of breath. 03/31/15  Yes Phineas Semen, MD  budesonide-formoterol [facility]) 160-4.5 MCG/ACT inhaler Inhale 2 puffs into the lungs 2 (two) times daily.   Yes [provider]  lisinopril-hydrochlorothiazide (PRINZIDE,ZESTORETIC) 20-25 MG tablet Take 1 tablet by mouth daily.    Yes [provider]  azithromycin (ZITHROMAX) 250 MG tablet Take 1 tablet (250 mg total) by mouth daily. Take first 2 tablets together, then 1 every day until finished. 06/15/18   Lurline Idol, FNP  benzonatate (TESSALON) 100 MG capsule Take 1 capsule (100 mg total) by mouth 3 (three) times daily as needed for cough. 06/15/18   Lurline Idol, FNP  oseltamivir (TAMIFLU) 75 MG capsule Take 1 capsule (75 mg total) by mouth every 12 (twelve) hours. 06/15/18   Lurline Idol, FNP  predniSONE (DELTASONE) 20 MG tablet Take 2 tablets (40 mg total) by mouth daily for 5 days. 06/15/18 06/20/18  Lurline Idol, FNP    Family History Family History  Problem Relation Age of Onset  . Cervical cancer Mother     Social History Social History   Tobacco Use  . Smoking status: Former Games developer  . Smokeless tobacco: Never Used  Substance Use Topics  . Alcohol use: No  . Drug use: Never     Allergies   Patient has no known allergies.   Review of Systems Review of Systems   Physical Exam Triage Vital Signs ED Triage Vitals  Enc Vitals Group     BP 06/15/18 1724 (!) 160/122     Pulse Rate 06/15/18 1724 (!) 134     Resp 06/15/18 1724 16     Temp 06/15/18 1724 (!) 102.4 F (39.1 C)     Temp Source 06/15/18 1724 Oral     SpO2 06/15/18 1724 96 %     Weight 06/15/18 1718 206  lb (93.4 kg)     Height 06/15/18 1718 5\' 2"  (1.575 m)     Head Circumference --      Peak Flow --      Pain Score --      Pain Loc --      Pain Edu? --      Excl. in GC? --    No data found.  Updated Vital Signs BP (!) 160/122 (BP Location: Left Arm)   Pulse (!) 134   Temp (!) 102.4 F (39.1 C) (Oral)   Resp 16   Ht 5\' 2"  (1.575 m)   Wt 206 lb (93.4 kg)   LMP 06/01/2018   SpO2 96%   BMI 37.68 kg/m   Visual Acuity Right Eye Distance:   Left Eye Distance:   Bilateral Distance:    Right Eye Near:   Left Eye Near:    Bilateral Near:     Physical Exam   UC Treatments / Results    Labs (all labs ordered are listed, but only abnormal results are displayed) Labs Reviewed  RAPID INFLUENZA A&B ANTIGENS ([facility] ONLY) - Abnormal; Notable for the following components:      Result Value   Influenza B ([facility]) POSITIVE (*)    All other components within normal limits    EKG None  Radiology Dg Chest 2 View  Result Date: 06/15/2018 CLINICAL DATA:  Cough and fever.  Cold symptoms for 2 weeks. EXAM: CHEST - 2 VIEW COMPARISON:  11/03/2015 FINDINGS: The cardiomediastinal silhouette is within normal limits. The lungs are well inflated and clear. There is no evidence of pleural effusion or pneumothorax. No acute osseous abnormality is identified. IMPRESSION: No active cardiopulmonary disease. Electronically Signed   By: Sebastian AcheAllen  Grady M.D.   On: 06/15/2018 18:17    Procedures Procedures (including critical care time)  Medications Ordered in UC Medications  acetaminophen (TYLENOL) tablet 1,000 mg (1,000 mg Oral Given 06/15/18 1833)    Initial Impression / Assessment and Plan / UC Course  I have reviewed the triage vital signs and the nursing notes.  Pertinent labs & imaging results that were available during my care of the patient were reviewed by me and considered in my medical decision making (see chart for details).     32 year old female presenting with a two-week history of gradually worsening URI symptoms.  She has had chills and body aches for the past 2 days as well. She has been using her albuterol inhaler as well as over-the-counter agents with minimal improvement in her symptoms.  She denies any known fever; however at clinic presentation she is febrile at 102.4 and tachycardic in the 130s.  Notably, patient had an albuterol nebulizer prior to clinic arrival.  She is ill-appearing but nontoxic. She was given a gram of Tylenol in the clinic and does not want to wait for reevaluation. I suspect that the tachycardia is a combination of the albuterol neb and fever.  CXR for  anything acute.  She is positive for influenza A.  Patient is alert and oriented.  We will discharge home this time with strict ED precautions. Suggested symptomatic OTC remedies. Follow up with PCP as needed.   Today's evaluation has revealed no signs of a dangerous process. Discussed diagnosis with patient. Patient aware of their diagnosis, possible red flag symptoms to watch out for and need for close follow up. Patient understands verbal and written discharge instructions. Patient comfortable with plan and disposition.  Patient has a clear mental status  at this time, good insight into illness (after discussion and teaching) and has clear judgment to make decisions regarding their care.  Documentation was completed with the aid of voice recognition software. Transcription may contain typographical errors. Final Clinical Impressions(s) / UC Diagnoses   Final diagnoses:  Influenza due to identified novel influenza A virus with other respiratory manifestations     Discharge Instructions     Take medications as prescribed.  Continue your inhaler as needed for wheezing.  Tylenol and/or ibuprofen for fevers and body aches.  Drink plenty of fluids to stay hydrated.  Report to the emergency room immediately if you have any increasing shortness of breath, fevers unrelieved with medications, chest pain or any other concerning symptoms.  Otherwise, follow-up with your primary care as needed.    ED Prescriptions    Medication Sig Dispense Auth. Provider   oseltamivir (TAMIFLU) 75 MG capsule Take 1 capsule (75 mg total) by mouth every 12 (twelve) hours. 10 capsule Lurline IdolMurrill, Avia Merkley, FNP   azithromycin (ZITHROMAX) 250 MG tablet Take 1 tablet (250 mg total) by mouth daily. Take first 2 tablets together, then 1 every day until finished. 6 tablet Lurline IdolMurrill, Keyra Virella, FNP   benzonatate (TESSALON) 100 MG capsule Take 1 capsule (100 mg total) by mouth 3 (three) times daily as needed for cough. 21 capsule Lurline IdolMurrill,  Alessa Mazur, FNP   predniSONE (DELTASONE) 20 MG tablet Take 2 tablets (40 mg total) by mouth daily for 5 days. 10 tablet Lurline IdolMurrill, Quantarius Genrich, FNP     Controlled Substance Prescriptions [place] Controlled Substance Registry consulted? Not Applicable   Lurline IdolMurrill, Chairty Toman, FNP 06/15/18 1839    Lurline IdolMurrill, Lindy Garczynski, FNP 06/15/18 [phone]

## 2018-06-15 NOTE — Discharge Instructions (Addendum)
Take medications as prescribed.  Continue your inhaler as needed for wheezing.  Tylenol and/or ibuprofen for fevers and body aches.  Drink plenty of fluids to stay hydrated.  Report to the emergency room immediately if you have any increasing shortness of breath, fevers unrelieved with medications, chest pain or any other concerning symptoms.  Otherwise, follow-up with your primary care as needed.

## 2018-06-15 NOTE — ED Triage Notes (Signed)
Patient c/o cough x couple weeks. Patient stated congestion in chest.

## 2019-04-13 ENCOUNTER — Ambulatory Visit
Admission: EM | Admit: 2019-04-13 | Discharge: 2019-04-13 | Disposition: A | Discharge status: Home / Self Care | Payer: BC Managed Care – PPO | Attending: Emergency Medicine | Admitting: Emergency Medicine

## 2019-04-13 ENCOUNTER — Other Ambulatory Visit: Payer: Self-pay

## 2019-04-13 ENCOUNTER — Encounter: Payer: Self-pay | Admitting: Emergency Medicine

## 2019-04-13 DIAGNOSIS — I10 Essential (primary) hypertension: Secondary | ICD-10-CM | POA: Diagnosis not present

## 2019-04-13 DIAGNOSIS — Z760 Encounter for issue of repeat prescription: Secondary | ICD-10-CM

## 2019-04-13 MED ORDER — BUDESONIDE-FORMOTEROL FUMARATE 160-4.5 MCG/ACT IN AERO
2.0000 | INHALATION_SPRAY | Freq: Two times a day (BID) | RESPIRATORY_TRACT | 1 refills | Status: DC
Start: 2019-04-13 — End: 2019-05-06

## 2019-04-13 NOTE — Discharge Instructions (Addendum)
Follow up with primary care provider as soon as possible °

## 2019-04-13 NOTE — ED Triage Notes (Signed)
Patient in today requesting refill on Symbicort. Patient is completely out.

## 2019-04-13 NOTE — ED Provider Notes (Signed)
[facility] [place] [facility] [place]   Name: Jessica Humphrey DOB: 1986/04/07 MRN: [id number] CSN: [id number] PCP: Patient, No Pcp Per  Arrival date and time:  04/13/19 0825  Chief Complaint:  Medication Refill (APPT)   NOTE: Prior to seeing the patient today, I have reviewed the triage nursing documentation and vital signs. Clinical staff has updated patient's PMH/PSHx, current medication list, and drug allergies/intolerances to ensure comprehensive history available to assist in medical decision making.   History:   HPI: Jessica Humphrey is a 33 y.o. female who presents today with a request to refill her Symbicort. She has been out of her medication for about 2 weeks while she is in between PCPs. She denies any acute shortness of breath, coughing or wheezing.   She has a history of high blood pressure; she did not take her mediation this morning.   Past Medical History:  Diagnosis Date  . Asthma   . Hypertension     Past Surgical History:  Procedure Laterality Date  . CESAREAN SECTION      Family History  Problem Relation Age of Onset  . Cervical cancer Mother   . Hypertension Father     Social History   Tobacco Use  . Smoking status: Former Smoker    Quit date: 04/12/2018    Years since quitting: 1.0  . Smokeless tobacco: Never Used  Substance Use Topics  . Alcohol use: No  . Drug use: Never    Patient Active Problem List   Diagnosis Date Noted  . Acute respiratory failure with hypoxia (HCC) 11/03/2015  . HTN (hypertension), benign 11/03/2015  . Asthma exacerbation 04/01/2015    Home Medications:    Current Meds  Medication Sig  . albuterol (PROVENTIL HFA;VENTOLIN HFA) 108 (90 BASE) MCG/ACT inhaler Inhale 2 puffs into the lungs every 6 (six) hours as needed for wheezing or shortness of breath.  . lisinopril-hydrochlorothiazide (PRINZIDE,ZESTORETIC) 20-25 MG tablet Take 1 tablet by mouth daily.  . [DISCONTINUED] budesonide-formoterol  (SYMBICORT) 160-4.5 MCG/ACT inhaler Inhale 2 puffs into the lungs 2 (two) times daily.    Allergies:   Patient has no known allergies.  Review of Systems (ROS): Review of Systems  Constitutional: Negative for chills and fever.  HENT: Negative for ear pain and sore throat.   Eyes: Negative for pain and visual disturbance.  Respiratory: Negative for cough and shortness of breath.   Cardiovascular: Negative for chest pain and palpitations.  Gastrointestinal: Negative for abdominal pain and vomiting.  Genitourinary: Negative for dysuria and hematuria.  Musculoskeletal: Negative for arthralgias and back pain.  Skin: Negative for color change and rash.  Neurological: Negative for seizures and syncope.  All other systems reviewed and are negative.    Vital Signs: Today's Vitals   04/13/19 0833 04/13/19 0836 04/13/19 0847  BP:  (!) 156/119 (!) 162/108  Pulse:  (!) 103   Resp:  18   Temp:  98.6 F (37 C)   TempSrc:  Oral   SpO2:  100%   Weight: 210 lb (95.3 kg)    Height: 5\' 2"  (1.575 m)    PainSc: 0-No pain      Physical Exam: Physical Exam Vitals signs and nursing note reviewed.  Constitutional:      Appearance: Normal appearance.  HENT:     Mouth/Throat:     Mouth: Mucous membranes are moist.     Pharynx: No oropharyngeal exudate or posterior oropharyngeal erythema.  Cardiovascular:     Rate and Rhythm:  Normal rate and regular rhythm.     Heart sounds: Normal heart sounds.  Pulmonary:     Effort: Pulmonary effort is normal.     Breath sounds: Normal breath sounds.  Neurological:     Mental Status: She is alert.      Urgent Care Treatments / Results:   LABS: PLEASE NOTE: all labs that were ordered this encounter are listed, however only abnormal results are displayed. Labs Reviewed - No data to display  EKG: -None  RADIOLOGY: No results found.  PROCEDURES: Procedures  MEDICATIONS RECEIVED THIS VISIT: Medications - No data to display  PERTINENT  CLINICAL COURSE NOTES/UPDATES:   Initial Impression / Assessment and Plan / Urgent Care Course:  Pertinent labs & imaging results that were available during my care of the patient were personally reviewed by me and considered in my medical decision making (see lab/imaging section of note for values and interpretations).  Jessica Humphrey is a 33 y.o. female who presents to [facility] today with a request for a medication refill, diagnosed with the same, and treated as such with the medications below. NP and patient reviewed discharge instructions below during visit.   Patient is well appearing overall in clinic today. She does not appear to be in any acute distress. Presenting symptoms (see HPI) and exam as documented above.   I have reviewed the follow up and strict return precautions for any new or worsening symptoms. Patient is aware of symptoms that would be deemed urgent/emergent, and would thus require further evaluation either here or in the emergency department. At the time of discharge, she verbalized understanding and consent with the discharge plan as it was reviewed with her. All questions were fielded by provider and/or clinic staff prior to patient discharge.    Final Clinical Impressions / Urgent Care Diagnoses:   Final diagnoses:  Medication refill  Essential hypertension    New Prescriptions:  [place] Controlled Substance Registry consulted? Not Applicable  Meds ordered this encounter  Medications  . budesonide-formoterol (SYMBICORT) 160-4.5 MCG/ACT inhaler    Sig: Inhale 2 puffs into the lungs 2 (two) times daily.    Dispense:  1 Inhaler    Refill:  1      Discharge Instructions     Follow-up with primary care provider as soon as possible.     Recommended Follow up Care:  Patient encouraged to follow up with the following provider within the specified time frame, or sooner as dictated by the severity of her symptoms. As always, she was instructed that  for any urgent/emergent care needs, she should seek care either here or in the emergency department for more immediate evaluation.   Gertie Baron, DNP, NP-c    Gertie Baron, NP 04/13/19 [phone]

## 2019-05-06 ENCOUNTER — Other Ambulatory Visit: Payer: Self-pay

## 2019-05-06 ENCOUNTER — Ambulatory Visit (INDEPENDENT_AMBULATORY_CARE_PROVIDER_SITE_OTHER): Payer: BC Managed Care – PPO | Admitting: Family Medicine

## 2019-05-06 ENCOUNTER — Encounter: Payer: Self-pay | Admitting: Family Medicine

## 2019-05-06 VITALS — BP 134/100 | HR 80 | Ht 62.0 in | Wt 215.0 lb

## 2019-05-06 DIAGNOSIS — I10 Essential (primary) hypertension: Secondary | ICD-10-CM

## 2019-05-06 DIAGNOSIS — J4520 Mild intermittent asthma, uncomplicated: Secondary | ICD-10-CM | POA: Diagnosis not present

## 2019-05-06 DIAGNOSIS — Z23 Encounter for immunization: Secondary | ICD-10-CM | POA: Diagnosis not present

## 2019-05-06 DIAGNOSIS — Z7689 Persons encountering health services in other specified circumstances: Secondary | ICD-10-CM

## 2019-05-06 MED ORDER — BUDESONIDE-FORMOTEROL FUMARATE 160-4.5 MCG/ACT IN AERO: 2.0000 | INHALATION_SPRAY | Freq: Two times a day (BID) | RESPIRATORY_TRACT | 1 refills | Status: DC

## 2019-05-06 MED ORDER — LISINOPRIL-HYDROCHLOROTHIAZIDE 20-25 MG PO TABS: 1.0000 | ORAL_TABLET | Freq: Every day | ORAL | 1 refills | Status: DC

## 2019-05-06 MED ORDER — ALBUTEROL SULFATE HFA 108 (90 BASE) MCG/ACT IN AERS: 2.0000 | INHALATION_SPRAY | Freq: Four times a day (QID) | RESPIRATORY_TRACT | 11 refills | Status: DC | PRN

## 2019-05-06 NOTE — Progress Notes (Signed)
Date:  05/06/2019   Name:  Jessica Humphrey   DOB:  06/22/1985   MRN:  [id number]   Chief Complaint: Establish Care (practice closed), tdap vacc need, Asthma, and Hypertension (no med x 4 days until today)  Patient is a 33 year old female who presents for an establish new patient exam. The patient reports the following problems: hypertension. Health maintenance has been reviewed ? hyperglycemia  Asthma She complains of wheezing. There is no chest tightness, cough, difficulty breathing, frequent throat clearing, hemoptysis, hoarse voice, shortness of breath or sputum production. This is a recurrent problem. The current episode started more than 1 year ago. The problem occurs intermittently. The problem has been gradually worsening. Pertinent negatives include no appetite change, chest pain, dyspnea on exertion, ear congestion, ear pain, fever, headaches, heartburn, malaise/fatigue, myalgias, nasal congestion, orthopnea, PND, postnasal drip, rhinorrhea, sneezing, sore throat, sweats, trouble swallowing or weight loss. Her symptoms are alleviated by beta-agonist and steroid inhaler. She reports moderate improvement on treatment. Her past medical history is significant for asthma. There is no history of bronchiectasis, bronchitis, COPD, emphysema or pneumonia.  Hypertension This is a chronic problem. The current episode started more than 1 year ago. The problem has been waxing and waning since onset. The problem is controlled. Pertinent negatives include no anxiety, blurred vision, chest pain, headaches, malaise/fatigue, neck pain, orthopnea, palpitations, peripheral edema, PND, shortness of breath or sweats. The current treatment provides moderate improvement. There are no compliance problems.  There is no history of angina, kidney disease, CAD/MI, CVA, heart failure, left ventricular hypertrophy or PVD. There is no history of chronic renal disease, a hypertension causing med or renovascular disease.     Lab Results  Component Value Date   CREATININE 0.64 11/04/2015   BUN 9 11/04/2015   NA 137 11/04/2015   K 4.2 11/04/2015   CL 105 11/04/2015   CO2 23 11/04/2015   No results found for: CHOL, HDL, LDLCALC, LDLDIRECT, TRIG, CHOLHDL No results found for: TSH Lab Results  Component Value Date   HGBA1C 5.5 03/31/2015     Review of Systems  Constitutional: Negative.  Negative for appetite change, chills, fatigue, fever, malaise/fatigue, unexpected weight change and weight loss.  HENT: Negative for congestion, ear discharge, ear pain, hoarse voice, postnasal drip, rhinorrhea, sinus pressure, sneezing, sore throat and trouble swallowing.   Eyes: Negative for blurred vision, photophobia, pain, discharge, redness and itching.  Respiratory: Positive for wheezing. Negative for cough, hemoptysis, sputum production, shortness of breath and stridor.   Cardiovascular: Negative for chest pain, dyspnea on exertion, palpitations, orthopnea and PND.  Gastrointestinal: Negative for abdominal pain, blood in stool, constipation, diarrhea, heartburn, nausea and vomiting.  Endocrine: Positive for polyphagia and polyuria. Negative for cold intolerance, heat intolerance and polydipsia.  Genitourinary: Negative for dysuria, flank pain, frequency, hematuria, menstrual problem, pelvic pain, urgency, vaginal bleeding and vaginal discharge.  Musculoskeletal: Negative for arthralgias, back pain, myalgias and neck pain.  Skin: Negative for rash.  Allergic/Immunologic: Negative for environmental allergies and food allergies.  Neurological: Negative for dizziness, weakness, light-headedness, numbness and headaches.  Hematological: Negative for adenopathy. Does not bruise/bleed easily.  Psychiatric/Behavioral: Negative for dysphoric mood. The patient is not nervous/anxious.     Patient Active Problem List   Diagnosis Date Noted  . Acute respiratory failure with hypoxia ([place]) 11/03/2015  . HTN (hypertension),  benign 11/03/2015  . Asthma exacerbation 04/01/2015    No Known Allergies  Past Surgical History:  Procedure Laterality Date  .  CESAREAN SECTION     x 1    Social History   Tobacco Use  . Smoking status: Former Smoker    Quit date: 04/12/2018    Years since quitting: 1.0  . Smokeless tobacco: Never Used  Substance Use Topics  . Alcohol use: No  . Drug use: Never     Medication list has been reviewed and updated.  Current Meds  Medication Sig  . albuterol (PROVENTIL HFA;VENTOLIN HFA) 108 (90 BASE) MCG/ACT inhaler Inhale 2 puffs into the lungs every 6 (six) hours as needed for wheezing or shortness of breath.  . budesonide-formoterol (SYMBICORT) 160-4.5 MCG/ACT inhaler Inhale 2 puffs into the lungs 2 (two) times daily.  Marland Kitchen lisinopril-hydrochlorothiazide (PRINZIDE,ZESTORETIC) 20-25 MG tablet Take 1 tablet by mouth daily.    PHQ 2/9 Scores 05/06/2019  PHQ - 2 Score 0  PHQ- 9 Score 0    BP Readings from Last 3 Encounters:  05/06/19 (!) 134/100  04/13/19 (!) 162/108  06/15/18 (!) 160/122    Physical Exam Vitals signs and nursing note reviewed.  Constitutional:      Appearance: She is well-developed.  HENT:     Head: Normocephalic.     Right Ear: External ear normal.     Left Ear: External ear normal.  Eyes:     General: Lids are everted, no foreign bodies appreciated. No scleral icterus.       Left eye: No foreign body or hordeolum.     Conjunctiva/sclera: Conjunctivae normal.     Right eye: Right conjunctiva is not injected.     Left eye: Left conjunctiva is not injected.     Pupils: Pupils are equal, round, and reactive to light.  Neck:     Musculoskeletal: Normal range of motion and neck supple.     Thyroid: No thyromegaly.     Vascular: No JVD.     Trachea: No tracheal deviation.  Cardiovascular:     Rate and Rhythm: Normal rate and regular rhythm.     Heart sounds: Normal heart sounds. No murmur. No friction rub. No gallop.   Pulmonary:     Effort:  Pulmonary effort is normal. No respiratory distress.     Breath sounds: Normal breath sounds. No wheezing or rales.  Abdominal:     General: Bowel sounds are normal.     Palpations: Abdomen is soft. There is no mass.     Tenderness: There is no abdominal tenderness. There is no guarding or rebound.  Musculoskeletal: Normal range of motion.        General: No tenderness.  Lymphadenopathy:     Cervical: No cervical adenopathy.  Skin:    General: Skin is warm.     Findings: No rash.  Neurological:     Mental Status: She is alert and oriented to person, place, and time.     Cranial Nerves: No cranial nerve deficit.     Deep Tendon Reflexes: Reflexes normal.  Psychiatric:        Mood and Affect: Mood is not anxious or depressed.     Wt Readings from Last 3 Encounters:  05/06/19 215 lb (97.5 kg)  04/13/19 210 lb (95.3 kg)  06/15/18 206 lb (93.4 kg)    BP (!) 134/100   Pulse 80   Ht 5\' 2"  (1.575 m)   Wt 215 lb (97.5 kg)   LMP 04/18/2019 (Exact Date)   BMI 39.32 kg/m   Assessment and Plan:  1. Establishing care with new doctor, encounter for Patient  establishes care with new physician.  Patient's previous encounters, labs, other have been reviewed.  2. Essential hypertension Patient has a history of hypertension.  This is currently stable.  Will continue lisinopril hydrochlorothiazide 20-25 mg once a day.  Will check renal function panel today. - Renal Function Panel - lisinopril-hydrochlorothiazide (ZESTORETIC) 20-25 MG tablet; Take 1 tablet by mouth daily.  Dispense: 90 tablet; Refill: 1  3. Mild intermittent asthma, unspecified whether complicated Patient has a history of reactive airway disease.  Will resume Symbicort 2 puffs twice a day as well as albuterol inhaler 2 puffs every 6 hours for rescue purposes. - albuterol (VENTOLIN HFA) 108 (90 Base) MCG/ACT inhaler; Inhale 2 puffs into the lungs every 6 (six) hours as needed for wheezing or shortness of breath.  Dispense:  18 g; Refill: 11 - budesonide-formoterol (SYMBICORT) 160-4.5 MCG/ACT inhaler; Inhale 2 puffs into the lungs 2 (two) times daily.  Dispense: 1 Inhaler; Refill: 1

## 2019-05-06 NOTE — Addendum Note (Signed)
Addended by: Fredderick Severance on: 05/06/2019 04:23 PM   Modules accepted: Orders

## 2019-05-07 LAB — RENAL FUNCTION PANEL
Albumin: 4.4 g/dL (ref 3.8–4.8)
BUN/Creatinine Ratio: 15 (ref 9–23)
BUN: 11 mg/dL (ref 6–20)
CO2: 22 mmol/L (ref 20–29)
Calcium: 9.6 mg/dL (ref 8.7–10.2)
Chloride: 100 mmol/L (ref 96–106)
Creatinine, Ser: 0.74 mg/dL (ref 0.57–1.00)
GFR calc Af Amer: 121 mL/min/{1.73_m2} (ref >59)
GFR calc non Af Amer: 105 mL/min/{1.73_m2} (ref >59)
Glucose: 94 mg/dL (ref 65–99)
Phosphorus: 3 mg/dL (ref 3.0–4.3)
Potassium: 4.1 mmol/L (ref 3.5–5.2)
Sodium: 141 mmol/L (ref 134–144)

## 2019-06-17 ENCOUNTER — Ambulatory Visit (INDEPENDENT_AMBULATORY_CARE_PROVIDER_SITE_OTHER): Payer: BC Managed Care – PPO | Admitting: Family Medicine

## 2019-06-17 ENCOUNTER — Other Ambulatory Visit: Payer: Self-pay

## 2019-06-17 ENCOUNTER — Encounter: Payer: Self-pay | Admitting: Family Medicine

## 2019-06-17 VITALS — BP 150/100 | HR 108 | Ht 62.0 in | Wt 206.0 lb

## 2019-06-17 DIAGNOSIS — J4520 Mild intermittent asthma, uncomplicated: Secondary | ICD-10-CM

## 2019-06-17 DIAGNOSIS — T486X1A Poisoning by antiasthmatics, accidental (unintentional), initial encounter: Secondary | ICD-10-CM

## 2019-06-17 DIAGNOSIS — J209 Acute bronchitis, unspecified: Secondary | ICD-10-CM

## 2019-06-17 DIAGNOSIS — I10 Essential (primary) hypertension: Secondary | ICD-10-CM

## 2019-06-17 DIAGNOSIS — J4521 Mild intermittent asthma with (acute) exacerbation: Secondary | ICD-10-CM | POA: Diagnosis not present

## 2019-06-17 MED ORDER — AMLODIPINE BESYLATE 2.5 MG PO TABS: 2.5000 mg | ORAL_TABLET | Freq: Every day | ORAL | 3 refills | Status: DC

## 2019-06-17 MED ORDER — AZITHROMYCIN 250 MG PO TABS: ORAL_TABLET | ORAL | 0 refills | Status: DC

## 2019-06-17 MED ORDER — PREDNISONE 10 MG PO TABS: ORAL_TABLET | ORAL | 1 refills | Status: DC

## 2019-06-17 NOTE — Progress Notes (Signed)
Date:  06/17/2019   Name:  Jessica Humphrey   DOB:  [birth year]   MRN:  [id number]   Chief Complaint: Hypertension (following up on b/p)  Hypertension This is a chronic problem. The current episode started more than 1 year ago. The problem has been waxing and waning since onset. The problem is uncontrolled. Pertinent negatives include no anxiety, blurred vision, chest pain, headaches, malaise/fatigue, neck pain, orthopnea, palpitations, peripheral edema, PND, shortness of breath or sweats. There are no associated agents to hypertension. Past treatments include ACE inhibitors and diuretics. The current treatment provides moderate improvement. There are no compliance problems.  There is no history of angina, kidney disease, CAD/MI, CVA, heart failure, left ventricular hypertrophy, PVD or retinopathy. There is no history of chronic renal disease, a hypertension causing med or renovascular disease.  Asthma She complains of chest tightness, cough, difficulty breathing, frequent throat clearing, sputum production and wheezing. There is no hemoptysis, hoarse voice or shortness of breath. This is a chronic problem. The current episode started more than 1 year ago. The problem occurs intermittently. The problem has been waxing and waning. The cough is productive of sputum. Associated symptoms include dyspnea on exertion and postnasal drip. Pertinent negatives include no appetite change, chest pain, ear congestion, ear pain, fever, headaches, heartburn, malaise/fatigue, myalgias, nasal congestion, orthopnea, PND, rhinorrhea, sneezing, sore throat, sweats or weight loss. Her past medical history is significant for asthma.    Lab Results  Component Value Date   CREATININE 0.74 05/06/2019   BUN 11 05/06/2019   NA 141 05/06/2019   K 4.1 05/06/2019   CL 100 05/06/2019   CO2 22 05/06/2019   No results found for: CHOL, HDL, LDLCALC, LDLDIRECT, TRIG, CHOLHDL No results found for: TSH Lab Results   Component Value Date   HGBA1C 5.5 03/31/2015     Review of Systems  Constitutional: Negative for appetite change, chills, fever, malaise/fatigue and weight loss.  HENT: Positive for postnasal drip. Negative for drooling, ear discharge, ear pain, hoarse voice, rhinorrhea, sneezing and sore throat.   Eyes: Negative for blurred vision.  Respiratory: Positive for cough, sputum production, chest tightness and wheezing. Negative for hemoptysis and shortness of breath.   Cardiovascular: Positive for dyspnea on exertion. Negative for chest pain, palpitations, orthopnea, leg swelling and PND.  Gastrointestinal: Negative for abdominal pain, blood in stool, constipation, diarrhea, heartburn and nausea.  Endocrine: Negative for polydipsia.  Genitourinary: Negative for dysuria, frequency, hematuria and urgency.  Musculoskeletal: Negative for back pain, myalgias and neck pain.  Skin: Negative for rash.  Allergic/Immunologic: Negative for environmental allergies.  Neurological: Negative for dizziness and headaches.  Hematological: Does not bruise/bleed easily.  Psychiatric/Behavioral: Negative for suicidal ideas. The patient is not nervous/anxious.     Patient Active Problem List   Diagnosis Date Noted  . Acute respiratory failure with hypoxia ([facility]) 11/03/2015  . HTN (hypertension), benign 11/03/2015  . Asthma exacerbation 04/01/2015    No Known Allergies  Past Surgical History:  Procedure Laterality Date  . CESAREAN SECTION     x 1    Social History   Tobacco Use  . Smoking status: Former Smoker    Quit date: 04/12/2018    Years since quitting: 1.1  . Smokeless tobacco: Never Used  Substance Use Topics  . Alcohol use: No  . Drug use: Never     Medication list has been reviewed and updated.  Current Meds  Medication Sig  . albuterol (VENTOLIN HFA) 108 (90 Base) MCG/ACT  inhaler Inhale 2 puffs into the lungs every 6 (six) hours as needed for wheezing or shortness of breath.  .  budesonide-formoterol (SYMBICORT) 160-4.5 MCG/ACT inhaler Inhale 2 puffs into the lungs 2 (two) times daily.  Marland Kitchen. lisinopril-hydrochlorothiazide (ZESTORETIC) 20-25 MG tablet Take 1 tablet by mouth daily.    PHQ 2/9 Scores 05/06/2019  PHQ - 2 Score 0  PHQ- 9 Score 0    BP Readings from Last 3 Encounters:  06/17/19 (!) 150/100  05/06/19 (!) 134/100  04/13/19 (!) 162/108    Physical Exam Vitals and nursing note reviewed.  Constitutional:      Appearance: She is well-developed.  HENT:     Head: Normocephalic.     Right Ear: Tympanic membrane, ear canal and external ear normal.     Left Ear: Tympanic membrane, ear canal and external ear normal.     Nose: Nose normal.  Eyes:     General: Lids are everted, no foreign bodies appreciated. No scleral icterus.       Left eye: No foreign body or hordeolum.     Conjunctiva/sclera: Conjunctivae normal.     Right eye: Right conjunctiva is not injected.     Left eye: Left conjunctiva is not injected.     Pupils: Pupils are equal, round, and reactive to light.  Neck:     Thyroid: No thyromegaly.     Vascular: No JVD.     Trachea: No tracheal deviation.  Cardiovascular:     Rate and Rhythm: Normal rate and regular rhythm.     Heart sounds: Normal heart sounds. No murmur. No friction rub. No gallop.   Pulmonary:     Effort: Pulmonary effort is normal. No accessory muscle usage, respiratory distress or retractions.     Breath sounds: Decreased air movement present. Wheezing present. No decreased breath sounds, rhonchi or rales.  Abdominal:     General: Bowel sounds are normal.     Palpations: Abdomen is soft. There is no mass.     Tenderness: There is no abdominal tenderness. There is no guarding or rebound.  Musculoskeletal:        General: No tenderness. Normal range of motion.     Cervical back: Normal range of motion and neck supple.  Lymphadenopathy:     Cervical: No cervical adenopathy.  Skin:    General: Skin is warm.      Coloration: Skin is not pale.     Findings: No rash.  Neurological:     Mental Status: She is alert and oriented to person, place, and time.     Cranial Nerves: No cranial nerve deficit.     Deep Tendon Reflexes: Reflexes normal.  Psychiatric:        Mood and Affect: Mood is not anxious or depressed.     Wt Readings from Last 3 Encounters:  06/17/19 206 lb (93.4 kg)  05/06/19 215 lb (97.5 kg)  04/13/19 210 lb (95.3 kg)    BP (!) 150/100   Pulse 100   Ht 5\' 2"  (1.575 m)   Wt 206 lb (93.4 kg)   LMP 06/14/2019 (Exact Date)   BMI 37.68 kg/m   Assessment and Plan: 1. Essential hypertension Patient for return in evaluation of blood pressure on lisinopril hydrochlorothiazide 2025 mg.  Patient has not achieved level of control so we will continue the current combination along with addition of amlodipine 2.5 mg patient is noted to be tachycardic but this is thought to be likely secondary to overuse of  her albuterol because of her current asthmatic acute exacerbation. - amLODipine (NORVASC) 2.5 MG tablet; Take 1 tablet (2.5 mg total) by mouth daily.  Dispense: 90 tablet; Refill: 3  2. Mild intermittent asthma, unspecified whether complicated Chronic.  Mild.  Intermittent.  Patient is currently with exacerbation and upon review patient has been taking her albuterol on an as-needed basis multiple times during the day including her Symbicort as well.  Patient has been instructed to take as directed and we will make additional medications to control her asthma.  3. Mild intermittent asthma with exacerbation Chronic.  Uncontrolled.  Stable.  Pulse ox is 95.  Patient has required prednisone in the past and we will initiate prednisone in a taper of 40 mg over 12 days.  Given the possible nature of a community-acquired pneumonia we will also initiate a azithromycin 250 mg 2 today followed by 1 a day for 4 days.  Patient will return next week for reevaluation sooner if there is any instability.   Patient was given some DuoNeb to be used with a machine at home that her son has. - predniSONE (DELTASONE) 10 MG tablet; 4,4,4,3,3,3,2,2,2,1,1,1  Dispense: 30 tablet; Refill: 1 - azithromycin (ZITHROMAX) 250 MG tablet; 2 today then 1 a day for 4 days  Dispense: 6 tablet; Refill: 0  4. Accidental albuterol overdose, initial encounter Patient has misjudges her albuterol dosing and has developed some elevation of her blood pressure as well as her pulse rate due to the additional dosing of beta agonist medication.  5. Acute bronchitis, unspecified organism Chronic.  Controlled.  Stable.  Patient has a persistent nonproductive cough and as noted above we are covering for community-acquired pneumonia. - azithromycin (ZITHROMAX) 250 MG tablet; 2 today then 1 a day for 4 days  Dispense: 6 tablet; Refill: 0

## 2019-06-24 ENCOUNTER — Encounter: Payer: Self-pay | Admitting: Family Medicine

## 2019-06-24 ENCOUNTER — Other Ambulatory Visit: Payer: Self-pay

## 2019-06-24 ENCOUNTER — Ambulatory Visit (INDEPENDENT_AMBULATORY_CARE_PROVIDER_SITE_OTHER): Payer: BC Managed Care – PPO | Admitting: Family Medicine

## 2019-06-24 VITALS — BP 140/92 | HR 84 | Ht 62.0 in | Wt 209.0 lb

## 2019-06-24 DIAGNOSIS — E6609 Other obesity due to excess calories: Secondary | ICD-10-CM

## 2019-06-24 MED ORDER — AMLODIPINE BESYLATE 5 MG PO TABS: 5.0000 mg | ORAL_TABLET | Freq: Every day | ORAL | 1 refills | Status: DC

## 2019-06-24 NOTE — Progress Notes (Signed)
Date:  06/24/2019   Name:  Jessica Humphrey   DOB:  07-23-1985   MRN:  [id number]   Chief Complaint: Hypertension (follow up starting b/p med) and Follow-up (bronchitis- has 4 more days on prednisone- feeling alot better)  Hypertension This is a chronic problem. The current episode started more than 1 year ago. The problem has been waxing and waning since onset. The problem is controlled. Pertinent negatives include no anxiety, blurred vision, chest pain, headaches, malaise/fatigue, neck pain, orthopnea, palpitations, peripheral edema, PND, shortness of breath or sweats.    Lab Results  Component Value Date   CREATININE 0.74 05/06/2019   BUN 11 05/06/2019   NA 141 05/06/2019   K 4.1 05/06/2019   CL 100 05/06/2019   CO2 22 05/06/2019   No results found for: CHOL, HDL, LDLCALC, LDLDIRECT, TRIG, CHOLHDL No results found for: TSH Lab Results  Component Value Date   HGBA1C 5.5 03/31/2015     Review of Systems  Constitutional: Negative for chills, fever and malaise/fatigue.  HENT: Negative for drooling, ear discharge, ear pain and sore throat.   Eyes: Negative for blurred vision.  Respiratory: Negative for cough, shortness of breath and wheezing.   Cardiovascular: Negative for chest pain, palpitations, orthopnea, leg swelling and PND.  Gastrointestinal: Negative for abdominal pain, blood in stool, constipation, diarrhea and nausea.  Endocrine: Negative for polydipsia.  Genitourinary: Negative for dysuria, frequency, hematuria and urgency.  Musculoskeletal: Negative for back pain, myalgias and neck pain.  Skin: Negative for rash.  Allergic/Immunologic: Negative for environmental allergies.  Neurological: Negative for dizziness and headaches.  Hematological: Does not bruise/bleed easily.  Psychiatric/Behavioral: Negative for suicidal ideas. The patient is not nervous/anxious.     Patient Active Problem List   Diagnosis Date Noted  . Acute respiratory failure with hypoxia  ([place]) 11/03/2015  . HTN (hypertension), benign 11/03/2015  . Asthma exacerbation 04/01/2015    No Known Allergies  Past Surgical History:  Procedure Laterality Date  . CESAREAN SECTION     x 1    Social History   Tobacco Use  . Smoking status: Former Smoker    Quit date: 04/12/2018    Years since quitting: 1.2  . Smokeless tobacco: Never Used  Substance Use Topics  . Alcohol use: No  . Drug use: Never     Medication list has been reviewed and updated.  Current Meds  Medication Sig  . albuterol (VENTOLIN HFA) 108 (90 Base) MCG/ACT inhaler Inhale 2 puffs into the lungs every 6 (six) hours as needed for wheezing or shortness of breath.  Marland Kitchen amLODipine (NORVASC) 2.5 MG tablet Take 1 tablet (2.5 mg total) by mouth daily.  . budesonide-formoterol (SYMBICORT) 160-4.5 MCG/ACT inhaler Inhale 2 puffs into the lungs 2 (two) times daily.  Marland Kitchen lisinopril-hydrochlorothiazide (ZESTORETIC) 20-25 MG tablet Take 1 tablet by mouth daily.  . predniSONE (DELTASONE) 10 MG tablet 4,4,4,3,3,3,2,2,2,1,1,1    PHQ 2/9 Scores 05/06/2019  PHQ - 2 Score 0  PHQ- 9 Score 0    BP Readings from Last 3 Encounters:  06/24/19 (!) 140/92  06/17/19 (!) 150/100  05/06/19 (!) 134/100    Physical Exam Vitals and nursing note reviewed.  Constitutional:      General: She is not in acute distress.    Appearance: She is well-developed. She is not diaphoretic.  HENT:     Head: Normocephalic and atraumatic.     Right Ear: External ear normal.     Left Ear: External ear normal.  Nose: Nose normal.  Eyes:     General: Lids are everted, no foreign bodies appreciated. No scleral icterus.       Right eye: No discharge.        Left eye: No foreign body, discharge or hordeolum.     Conjunctiva/sclera: Conjunctivae normal.     Right eye: Right conjunctiva is not injected.     Left eye: Left conjunctiva is not injected.     Pupils: Pupils are equal, round, and reactive to light.  Neck:     Thyroid: No  thyromegaly.     Vascular: No JVD.     Trachea: No tracheal deviation.  Cardiovascular:     Rate and Rhythm: Normal rate and regular rhythm.     Pulses: No decreased pulses.     Heart sounds: Normal heart sounds, S1 normal and S2 normal. No murmur. No systolic murmur. No diastolic murmur. No friction rub. No gallop. No S3 or S4 sounds.   Pulmonary:     Effort: Pulmonary effort is normal. No respiratory distress.     Breath sounds: Normal breath sounds. No wheezing or rales.  Abdominal:     General: Bowel sounds are normal.     Palpations: Abdomen is soft. There is no mass.     Tenderness: There is no abdominal tenderness. There is no guarding or rebound.  Musculoskeletal:        General: No tenderness. Normal range of motion.     Cervical back: Normal range of motion and neck supple.  Lymphadenopathy:     Cervical: No cervical adenopathy.  Skin:    General: Skin is warm and dry.     Findings: No rash.  Neurological:     Mental Status: She is alert and oriented to person, place, and time.     Cranial Nerves: No cranial nerve deficit.     Deep Tendon Reflexes: Reflexes are normal and symmetric. Reflexes normal.  Psychiatric:        Mood and Affect: Mood is not anxious or depressed.     Wt Readings from Last 3 Encounters:  06/24/19 209 lb (94.8 kg)  06/17/19 206 lb (93.4 kg)  05/06/19 215 lb (97.5 kg)    BP (!) 140/92   Pulse 84   Ht 5\' 2"  (1.575 m)   Wt 209 lb (94.8 kg)   LMP 06/14/2019 (Exact Date)   BMI 38.23 kg/m   Assessment and Plan: 1. Class 1 obesity due to excess calories in adult, unspecified BMI, unspecified whether serious comorbidity present Chronic.  Persistent.  Patient seems motivated and we have discussed calorie restricted diet for weight loss.  Patient's goal of 10 pounds when I see her back in 6 months.Health risks of being over weight were discussed and patient was counseled on weight loss options and exercise.  2.  Hypertension essential.  Chronic.   Controlled.  Will continue lisinopril hydrochlorothiazide 20-25 and we will increase amlodipine to 5 mg once a day.  We will recheck patient in 6 months.

## 2019-06-24 NOTE — Patient Instructions (Signed)

## 2019-10-04 ENCOUNTER — Other Ambulatory Visit: Payer: Self-pay | Admitting: Family Medicine

## 2019-10-04 DIAGNOSIS — J4521 Mild intermittent asthma with (acute) exacerbation: Secondary | ICD-10-CM

## 2019-10-04 DIAGNOSIS — J4520 Mild intermittent asthma, uncomplicated: Secondary | ICD-10-CM

## 2019-11-11 ENCOUNTER — Other Ambulatory Visit: Payer: Self-pay | Admitting: Family Medicine

## 2019-11-11 DIAGNOSIS — J4521 Mild intermittent asthma with (acute) exacerbation: Secondary | ICD-10-CM

## 2019-11-11 NOTE — Telephone Encounter (Signed)
Requested medication (s) are due for refill today: yes  Requested medication (s) are on the active medication list: yes  Last refill:  08/05/19  Future visit scheduled: yes  Notes to clinic:  not delegated   Requested Prescriptions  Pending Prescriptions Disp Refills   predniSONE (DELTASONE) 10 MG tablet [Pharmacy Med Name: predniSONE 10 MG Oral Tablet] 30 tablet 0    Sig: TAKE 4 TABLETS BY MOUTH FOR 3 DAYS, THEN TAKE 3 TABLET FOR 3 DAYS, THEN TAKE 2 TABLETS FOR 3 DAYS, THEN TAKE 1 TABLETS FOR 3 DAYS      Not Delegated - Endocrinology:  Oral Corticosteroids Failed - 11/11/2019  1:12 PM      Failed - This refill cannot be delegated      Failed - Last BP in normal range    BP Readings from Last 1 Encounters:  06/24/19 (!) 140/92          Passed - Valid encounter within last 6 months    Recent Outpatient Visits           4 months ago Class 1 obesity due to excess calories in adult, unspecified BMI, unspecified whether serious comorbidity present   [facility] Duanne Limerick, MD   4 months ago Essential hypertension   [facility] Duanne Limerick, MD   6 months ago Establishing care with new doctor, encounter for   [facility] Duanne Limerick, MD       Future Appointments             In 1 month Duanne Limerick, MD [facility], [facility]

## 2019-11-12 ENCOUNTER — Ambulatory Visit: Payer: BC Managed Care – PPO | Admitting: Family Medicine

## 2019-11-14 ENCOUNTER — Other Ambulatory Visit: Payer: Self-pay

## 2019-11-14 ENCOUNTER — Encounter: Payer: Self-pay | Admitting: Family Medicine

## 2019-11-14 ENCOUNTER — Ambulatory Visit (INDEPENDENT_AMBULATORY_CARE_PROVIDER_SITE_OTHER): Payer: BC Managed Care – PPO | Admitting: Family Medicine

## 2019-11-14 DIAGNOSIS — I10 Essential (primary) hypertension: Secondary | ICD-10-CM

## 2019-11-14 DIAGNOSIS — J4520 Mild intermittent asthma, uncomplicated: Secondary | ICD-10-CM

## 2019-11-14 DIAGNOSIS — J4521 Mild intermittent asthma with (acute) exacerbation: Secondary | ICD-10-CM

## 2019-11-14 MED ORDER — LISINOPRIL-HYDROCHLOROTHIAZIDE 20-25 MG PO TABS: 1.0000 | ORAL_TABLET | Freq: Every day | ORAL | 1 refills | Status: DC

## 2019-11-14 MED ORDER — PREDNISONE 10 MG PO TABS: ORAL_TABLET | ORAL | 1 refills | Status: DC

## 2019-11-14 MED ORDER — ALBUTEROL SULFATE HFA 108 (90 BASE) MCG/ACT IN AERS: 2.0000 | INHALATION_SPRAY | Freq: Four times a day (QID) | RESPIRATORY_TRACT | 11 refills | Status: DC | PRN

## 2019-11-14 MED ORDER — AMLODIPINE BESYLATE 5 MG PO TABS: 5.0000 mg | ORAL_TABLET | Freq: Every day | ORAL | 1 refills | Status: DC

## 2019-11-14 MED ORDER — BUDESONIDE-FORMOTEROL FUMARATE 160-4.5 MCG/ACT IN AERO: 2.0000 | INHALATION_SPRAY | Freq: Two times a day (BID) | RESPIRATORY_TRACT | 11 refills | Status: DC

## 2019-11-14 NOTE — Progress Notes (Signed)
Date:  11/14/2019   Name:  Jessica Humphrey   DOB:  [birth year]   MRN:  [id number]   Chief Complaint: Asthma and Hypertension  Asthma She complains of chest tightness, cough, difficulty breathing, frequent throat clearing, shortness of breath and wheezing. There is no hemoptysis, hoarse voice or sputum production. This is a chronic problem. The current episode started 1 to 4 weeks ago (worse past 2 weeks). The problem occurs constantly. The problem has been waxing and waning. The cough is productive of sputum. Pertinent negatives include no ear pain, fever, headaches, malaise/fatigue, myalgias, nasal congestion, PND, postnasal drip, rhinorrhea, sneezing or sore throat. Her symptoms are aggravated by pollen, change in weather and exercise. Her past medical history is significant for asthma.  Hypertension This is a chronic problem. The current episode started more than 1 year ago. The problem is controlled. Associated symptoms include shortness of breath. Pertinent negatives include no anxiety, blurred vision, headaches, malaise/fatigue, neck pain, peripheral edema or PND. Past treatments include ACE inhibitors, diuretics and calcium channel blockers. The current treatment provides moderate improvement. There are no compliance problems.     Lab Results  Component Value Date   CREATININE 0.74 05/06/2019   BUN 11 05/06/2019   NA 141 05/06/2019   K 4.1 05/06/2019   CL 100 05/06/2019   CO2 22 05/06/2019   No results found for: CHOL, HDL, LDLCALC, LDLDIRECT, TRIG, CHOLHDL No results found for: TSH Lab Results  Component Value Date   HGBA1C 5.5 03/31/2015   Lab Results  Component Value Date   WBC 11.9 (H) 11/04/2015   HGB 12.0 11/04/2015   HCT 37.2 11/04/2015   MCV 82.0 11/04/2015   PLT 410 11/04/2015   No results found for: ALT, AST, GGT, ALKPHOS, BILITOT   Review of Systems  Constitutional: Negative.  Negative for chills, fatigue, fever, malaise/fatigue and unexpected weight  change.  HENT: Negative for congestion, ear discharge, ear pain, hoarse voice, postnasal drip, rhinorrhea, sinus pressure, sneezing and sore throat.   Eyes: Negative for blurred vision, photophobia, pain, discharge, redness and itching.  Respiratory: Positive for cough, shortness of breath and wheezing. Negative for hemoptysis, sputum production and stridor.   Cardiovascular: Negative for PND.  Gastrointestinal: Negative for abdominal pain, blood in stool, constipation, diarrhea, nausea and vomiting.  Endocrine: Negative for cold intolerance, heat intolerance, polydipsia, polyphagia and polyuria.  Genitourinary: Negative for dysuria, flank pain, frequency, hematuria, menstrual problem, pelvic pain, urgency, vaginal bleeding and vaginal discharge.  Musculoskeletal: Negative for arthralgias, back pain, myalgias and neck pain.  Skin: Negative for rash.  Allergic/Immunologic: Negative for environmental allergies and food allergies.  Neurological: Negative for dizziness, weakness, light-headedness, numbness and headaches.  Hematological: Negative for adenopathy. Does not bruise/bleed easily.  Psychiatric/Behavioral: Negative for dysphoric mood. The patient is not nervous/anxious.     Patient Active Problem List   Diagnosis Date Noted  . Acute respiratory failure with hypoxia (HCC) 11/03/2015  . HTN (hypertension), benign 11/03/2015  . Asthma exacerbation 04/01/2015    No Known Allergies  Past Surgical History:  Procedure Laterality Date  . CESAREAN SECTION     x 1    Social History   Tobacco Use  . Smoking status: Former Smoker    Quit date: 04/12/2018    Years since quitting: 1.5  . Smokeless tobacco: Never Used  Substance Use Topics  . Alcohol use: No  . Drug use: Never     Medication list has been reviewed and updated.  Current Meds  Medication Sig  . albuterol (VENTOLIN HFA) 108 (90 Base) MCG/ACT inhaler Inhale 2 puffs into the lungs every 6 (six) hours as needed for  wheezing or shortness of breath.  Marland Kitchen amLODipine (NORVASC) 5 MG tablet Take 1 tablet (5 mg total) by mouth daily.  . budesonide-formoterol (SYMBICORT) 160-4.5 MCG/ACT inhaler Inhale 2 puffs by mouth twice daily  . lisinopril-hydrochlorothiazide (ZESTORETIC) 20-25 MG tablet Take 1 tablet by mouth daily.    PHQ 2/9 Scores 11/14/2019 05/06/2019  PHQ - 2 Score 0 0  PHQ- 9 Score 2 0    BP Readings from Last 3 Encounters:  11/14/19 (!) 130/100  06/24/19 (!) 140/92  06/17/19 (!) 150/100    Physical Exam Vitals and nursing note reviewed.  Constitutional:      General: She is not in acute distress.    Appearance: She is not diaphoretic.  HENT:     Head: Normocephalic and atraumatic.     Right Ear: Tympanic membrane, ear canal and external ear normal.     Left Ear: Tympanic membrane, ear canal and external ear normal.     Nose: Nose normal.  Eyes:     General:        Right eye: No discharge.        Left eye: No discharge.     Conjunctiva/sclera: Conjunctivae normal.     Pupils: Pupils are equal, round, and reactive to light.  Neck:     Thyroid: No thyromegaly.     Vascular: No JVD.  Cardiovascular:     Rate and Rhythm: Normal rate and regular rhythm.     Heart sounds: Normal heart sounds. No murmur. No friction rub. No gallop.   Pulmonary:     Effort: Pulmonary effort is normal.     Breath sounds: Decreased air movement present. Wheezing present. No decreased breath sounds, rhonchi or rales.  Abdominal:     General: Bowel sounds are normal.     Palpations: Abdomen is soft. There is no mass.     Tenderness: There is no abdominal tenderness. There is no guarding.  Musculoskeletal:        General: Normal range of motion.     Cervical back: Normal range of motion and neck supple.  Lymphadenopathy:     Cervical: No cervical adenopathy.  Skin:    General: Skin is warm and dry.  Neurological:     Mental Status: She is alert.     Deep Tendon Reflexes: Reflexes are normal and  symmetric.     Wt Readings from Last 3 Encounters:  11/14/19 210 lb (95.3 kg)  06/24/19 209 lb (94.8 kg)  06/17/19 206 lb (93.4 kg)    BP (!) 130/100   Pulse 80   Ht 5\' 2"  (1.575 m)   Wt 210 lb (95.3 kg)   LMP 11/12/2019 (Exact Date)   SpO2 97%   BMI 38.41 kg/m   Assessment and Plan:  1. Essential hypertension Neck.  Controlled.  Stable.  Continue lisinopril hydrochlorothiazide 20-25 mg once a day. - lisinopril-hydrochlorothiazide (ZESTORETIC) 20-25 MG tablet; Take 1 tablet by mouth daily.  Dispense: 90 tablet; Refill: 1  2. Mild intermittent asthma, unspecified whether complicated Episodic.  Intermittent.  Mild.  Presently with exacerbation.  For baseline we will refill patient's Symbicort 160-4.52 puffs twice a day and albuterol inhaler 1 to 2 puffs every 6 hours and will refer to pulmonary for evaluation and maximization of therapy. - budesonide-formoterol (SYMBICORT) 160-4.5 MCG/ACT inhaler; Inhale 2 puffs into the lungs 2 (  two) times daily.  Dispense: 1 Inhaler; Refill: 11 - albuterol (VENTOLIN HFA) 108 (90 Base) MCG/ACT inhaler; Inhale 2 puffs into the lungs every 6 (six) hours as needed for wheezing or shortness of breath.  Dispense: 18 g; Refill: 11 - Ambulatory referral to Pulmonology  3. Mild intermittent asthma with exacerbation Recent onset last 2 weeks.  Acute.  Persistent.  Acute exacerbation but relatively stable at this time.  Patient has required tapering dose of prednisone under the circumstances in the past and we will prescribe a tapering dose starting at 40 mg over the course of 2 weeks. - predniSONE (DELTASONE) 10 MG tablet; 4,4,4,3,3,3,2,2,2,1,1,1  Dispense: 30 tablet; Refill: 1

## 2019-11-15 ENCOUNTER — Ambulatory Visit: Payer: BC Managed Care – PPO | Admitting: Family Medicine

## 2019-12-24 ENCOUNTER — Ambulatory Visit: Payer: BC Managed Care – PPO | Admitting: Family Medicine

## 2020-04-23 IMAGING — RF DG FLUORO GUIDE LUMBAR PUNCTURE
1 series · 1 of 1 positions shown · non-contrast
Comparison: none

CLINICAL DATA: Papilledema.  Chronic headaches.

[Series 1: cp_standard · 0.27mm/px · 1 of 1 slices shown]
[im 1/1]
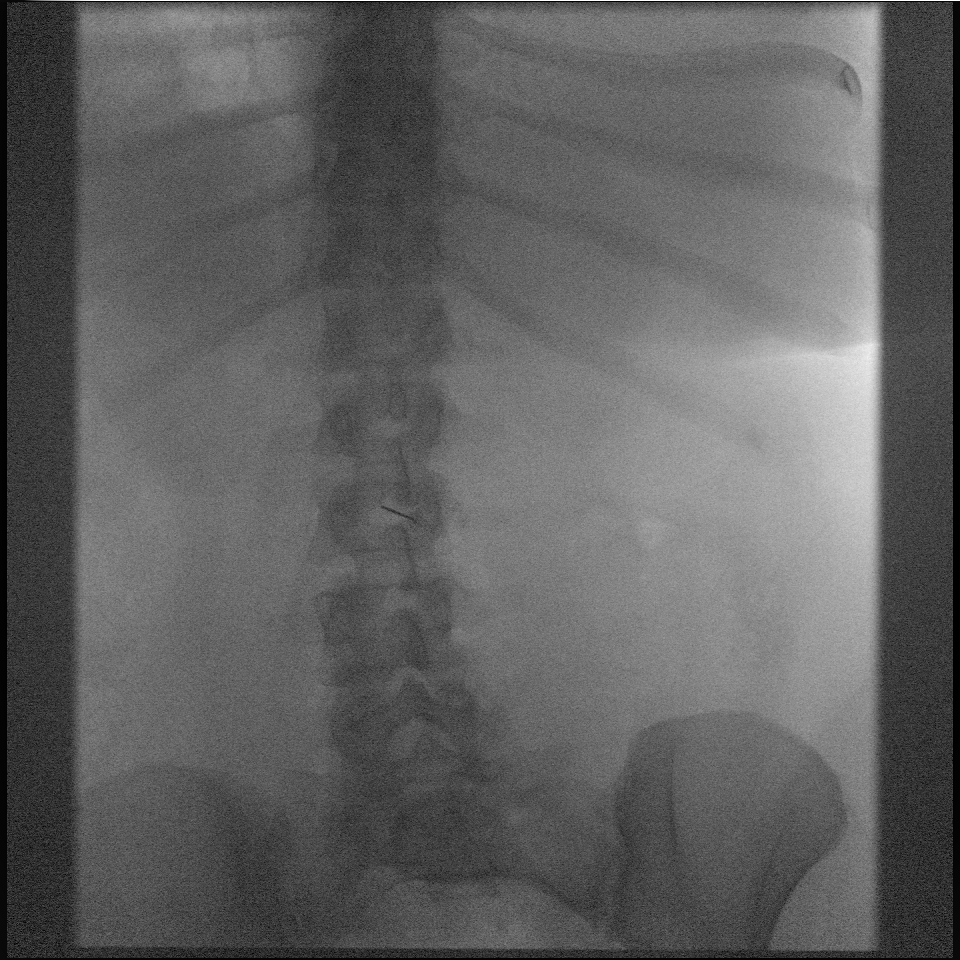

[1 of 1 positions shown; findings below may reference images not displayed]

EXAM:
DIAGNOSTIC LUMBAR PUNCTURE UNDER FLUOROSCOPIC GUIDANCE

FLUOROSCOPY TIME:  Fluoroscopy Time:  18 seconds

Number of Acquired Spot Images: 0

PROCEDURE:
Informed consent was obtained from the patient prior to the
procedure, including potential complications of headache, allergy,
and pain. With the patient prone, the lower back was prepped with
Betadine. 1% Lidocaine was used for local anesthesia. Lumbar
puncture was performed at the L2-3 level using a 20 gauge needle
with return of clear CSF with an opening pressure of 27 cm water.
Thirteen ml of CSF were obtained for laboratory studies. The patient
tolerated the procedure well and there were no apparent
complications.
IMPRESSION: 1. The opening pressure was 27 cm of water.

2. Uncomplicated lumbar puncture using fluoroscopic guidance
returning 13 mL of CSF.

## 2020-04-29 ENCOUNTER — Ambulatory Visit: Payer: BC Managed Care – PPO | Admitting: Family Medicine

## 2020-05-08 ENCOUNTER — Ambulatory Visit (INDEPENDENT_AMBULATORY_CARE_PROVIDER_SITE_OTHER): Payer: BC Managed Care – PPO | Admitting: Family Medicine

## 2020-05-08 ENCOUNTER — Encounter: Payer: Self-pay | Admitting: Family Medicine

## 2020-05-08 ENCOUNTER — Other Ambulatory Visit: Payer: Self-pay

## 2020-05-08 VITALS — BP 120/94 | HR 68 | Ht 62.0 in | Wt 218.0 lb

## 2020-05-08 DIAGNOSIS — I10 Essential (primary) hypertension: Secondary | ICD-10-CM | POA: Diagnosis not present

## 2020-05-08 MED ORDER — LISINOPRIL-HYDROCHLOROTHIAZIDE 20-25 MG PO TABS: 1.0000 | ORAL_TABLET | Freq: Every day | ORAL | 1 refills | Status: DC

## 2020-05-08 MED ORDER — AMLODIPINE BESYLATE 5 MG PO TABS: 5.0000 mg | ORAL_TABLET | Freq: Every day | ORAL | 1 refills | Status: DC

## 2020-05-08 NOTE — Progress Notes (Signed)
Date:  05/08/2020   Name:  Jessica Humphrey   DOB:  07/19/1985   MRN:  [id number]   Chief Complaint: Hypertension (added amlodipine to lisinopril/ HCTZ last visit)  Hypertension This is a chronic problem. The current episode started more than 1 year ago. The problem has been waxing and waning since onset. The problem is uncontrolled. Pertinent negatives include no anxiety, blurred vision, chest pain, headaches, malaise/fatigue, neck pain, orthopnea, palpitations, peripheral edema, PND, shortness of breath or sweats. There are no associated agents to hypertension. There are no known risk factors for coronary artery disease. Past treatments include ACE inhibitors, diuretics and calcium channel blockers. The current treatment provides moderate improvement. There are no compliance problems.  There is no history of angina, kidney disease, CAD/MI, CVA, heart failure, left ventricular hypertrophy, PVD or retinopathy. There is no history of chronic renal disease, a hypertension causing med or renovascular disease.    Lab Results  Component Value Date   CREATININE 0.74 05/06/2019   BUN 11 05/06/2019   NA 141 05/06/2019   K 4.1 05/06/2019   CL 100 05/06/2019   CO2 22 05/06/2019   No results found for: CHOL, HDL, LDLCALC, LDLDIRECT, TRIG, CHOLHDL No results found for: TSH Lab Results  Component Value Date   HGBA1C 5.5 03/31/2015   Lab Results  Component Value Date   WBC 11.9 (H) 11/04/2015   HGB 12.0 11/04/2015   HCT 37.2 11/04/2015   MCV 82.0 11/04/2015   PLT 410 11/04/2015   No results found for: ALT, AST, GGT, ALKPHOS, BILITOT   Review of Systems  Constitutional: Negative.  Negative for chills, fatigue, fever, malaise/fatigue and unexpected weight change.  HENT: Negative for congestion, ear discharge, ear pain, rhinorrhea, sinus pressure, sneezing and sore throat.   Eyes: Negative for blurred vision, photophobia, pain, discharge, redness and itching.  Respiratory: Negative for  cough, shortness of breath, wheezing and stridor.   Cardiovascular: Negative for chest pain, palpitations, orthopnea and PND.  Gastrointestinal: Negative for abdominal pain, blood in stool, constipation, diarrhea, nausea and vomiting.  Endocrine: Negative for cold intolerance, heat intolerance, polydipsia, polyphagia and polyuria.  Genitourinary: Negative for dysuria, flank pain, frequency, hematuria, menstrual problem, pelvic pain, urgency, vaginal bleeding and vaginal discharge.  Musculoskeletal: Negative for arthralgias, back pain, myalgias and neck pain.  Skin: Negative for rash.  Allergic/Immunologic: Negative for environmental allergies and food allergies.  Neurological: Negative for dizziness, weakness, light-headedness, numbness and headaches.  Hematological: Negative for adenopathy. Does not bruise/bleed easily.  Psychiatric/Behavioral: Negative for dysphoric mood. The patient is not nervous/anxious.     Patient Active Problem List   Diagnosis Date Noted  . Acute respiratory failure with hypoxia (HCC) 11/03/2015  . HTN (hypertension), benign 11/03/2015  . Asthma exacerbation 04/01/2015    No Known Allergies  Past Surgical History:  Procedure Laterality Date  . CESAREAN SECTION     x 1    Social History   Tobacco Use  . Smoking status: Former Smoker    Quit date: 04/12/2018    Years since quitting: 2.0  . Smokeless tobacco: Never Used  Vaping Use  . Vaping Use: Never used  Substance Use Topics  . Alcohol use: No  . Drug use: Never     Medication list has been reviewed and updated.  Current Meds  Medication Sig  . albuterol (VENTOLIN HFA) 108 (90 Base) MCG/ACT inhaler Inhale 2 puffs into the lungs every 6 (six) hours as needed for wheezing or shortness of breath.  Marland Kitchen  amLODipine (NORVASC) 2.5 MG tablet Take 2.5 mg by mouth daily.  Marland Kitchen lisinopril-hydrochlorothiazide (ZESTORETIC) 20-25 MG tablet Take 1 tablet by mouth daily.  Harrel Carina ELLIPTA 200-62.5-25 MCG/INH  AEPB pulm  . [DISCONTINUED] amLODipine (NORVASC) 5 MG tablet Take 1 tablet (5 mg total) by mouth daily.  . [DISCONTINUED] budesonide-formoterol (SYMBICORT) 160-4.5 MCG/ACT inhaler Inhale 2 puffs into the lungs 2 (two) times daily.    PHQ 2/9 Scores 05/08/2020 11/14/2019 05/06/2019  PHQ - 2 Score 0 0 0  PHQ- 9 Score 2 2 0    GAD 7 : Generalized Anxiety Score 05/08/2020 11/14/2019 05/06/2019  Nervous, Anxious, on Edge 0 0 0  Control/stop worrying 0 0 0  Worry too much - different things 0 0 0  Trouble relaxing 2 1 0  Restless 0 0 0  Easily annoyed or irritable 0 0 0  Afraid - awful might happen 0 0 0  Total GAD 7 Score 2 1 0  Anxiety Difficulty Not difficult at all Not difficult at all -    BP Readings from Last 3 Encounters:  05/08/20 (!) 120/94  11/14/19 126/86  06/24/19 (!) 140/92    Physical Exam Vitals and nursing note reviewed.  Constitutional:      Appearance: She is well-developed.  HENT:     Head: Normocephalic.     Right Ear: Tympanic membrane and external ear normal.     Left Ear: Tympanic membrane and external ear normal.     Nose: Nose normal.     Mouth/Throat:     Mouth: Mucous membranes are moist.  Eyes:     General: Lids are everted, no foreign bodies appreciated. No scleral icterus.       Left eye: No foreign body or hordeolum.     Conjunctiva/sclera: Conjunctivae normal.     Right eye: Right conjunctiva is not injected.     Left eye: Left conjunctiva is not injected.     Pupils: Pupils are equal, round, and reactive to light.  Neck:     Thyroid: No thyromegaly.     Vascular: No JVD.     Trachea: No tracheal deviation.  Cardiovascular:     Rate and Rhythm: Normal rate and regular rhythm.     Heart sounds: Normal heart sounds. No murmur heard.  No friction rub. No gallop.   Pulmonary:     Effort: Pulmonary effort is normal. No respiratory distress.     Breath sounds: Normal breath sounds. No wheezing, rhonchi or rales.  Abdominal:     General:  Bowel sounds are normal.     Palpations: Abdomen is soft. There is no mass.     Tenderness: There is no abdominal tenderness. There is no guarding or rebound.  Musculoskeletal:        General: No tenderness. Normal range of motion.     Cervical back: Normal range of motion and neck supple.  Lymphadenopathy:     Cervical: No cervical adenopathy.  Skin:    General: Skin is warm.     Findings: No rash.  Neurological:     Mental Status: She is alert and oriented to person, place, and time.     Cranial Nerves: No cranial nerve deficit.     Deep Tendon Reflexes: Reflexes normal.  Psychiatric:        Mood and Affect: Mood is not anxious or depressed.     Wt Readings from Last 3 Encounters:  05/08/20 218 lb (98.9 kg)  11/14/19 210 lb (95.3 kg)  06/24/19 209 lb (94.8 kg)    BP (!) 120/94   Pulse 68   Ht 5\' 2"  (1.575 m)   Wt 218 lb (98.9 kg)   LMP 05/08/2020 (Exact Date)   BMI 39.87 kg/m   Assessment and Plan: 1. Essential hypertension Chronic.  Controlled.  Stable.  Continue lisinopril hydrochlorothiazide 20-25.  Patient's only been taking 2.5 mg of amlodipine so we will increase this to 5 mg once a day.  Will recheck in 6 months.  We will check renal function panel. - lisinopril-hydrochlorothiazide (ZESTORETIC) 20-25 MG tablet; Take 1 tablet by mouth daily.  Dispense: 90 tablet; Refill: 1 - amLODipine (NORVASC) 5 MG tablet; Take 1 tablet (5 mg total) by mouth daily.  Dispense: 90 tablet; Refill: 1 - Renal Function Panel

## 2020-05-09 LAB — RENAL FUNCTION PANEL
Albumin: 4.8 g/dL (ref 3.8–4.8)
BUN/Creatinine Ratio: 19 (ref 9–23)
BUN: 15 mg/dL (ref 6–20)
CO2: 24 mmol/L (ref 20–29)
Calcium: 10.1 mg/dL (ref 8.7–10.2)
Chloride: 99 mmol/L (ref 96–106)
Creatinine, Ser: 0.81 mg/dL (ref 0.57–1.00)
GFR calc Af Amer: 108 mL/min/{1.73_m2} (ref >59)
GFR calc non Af Amer: 94 mL/min/{1.73_m2} (ref >59)
Glucose: 96 mg/dL (ref 65–99)
Phosphorus: 3.7 mg/dL (ref 3.0–4.3)
Potassium: 3.9 mmol/L (ref 3.5–5.2)
Sodium: 140 mmol/L (ref 134–144)

## 2020-07-23 ENCOUNTER — Ambulatory Visit
Admission: EM | Admit: 2020-07-23 | Discharge: 2020-07-23 | Disposition: A | Discharge status: Home / Self Care | Payer: BC Managed Care – PPO | Attending: Family Medicine | Admitting: Family Medicine

## 2020-07-23 ENCOUNTER — Other Ambulatory Visit: Payer: Self-pay

## 2020-07-23 DIAGNOSIS — O209 Hemorrhage in early pregnancy, unspecified: Secondary | ICD-10-CM | POA: Insufficient documentation

## 2020-07-23 DIAGNOSIS — O10011 Pre-existing essential hypertension complicating pregnancy, first trimester: Secondary | ICD-10-CM | POA: Diagnosis present

## 2020-07-23 DIAGNOSIS — O4690 Antepartum hemorrhage, unspecified, unspecified trimester: Secondary | ICD-10-CM | POA: Diagnosis not present

## 2020-07-23 LAB — HCG, QUANTITATIVE, PREGNANCY: hCG, Beta Chain, Quant, S: 1545 m[IU]/mL — ABNORMAL HIGH (ref <5)

## 2020-07-23 MED ORDER — LABETALOL HCL 100 MG PO TABS
100.0000 mg | ORAL_TABLET | Freq: Two times a day (BID) | ORAL | 3 refills | Status: DC
Start: 2020-07-23 — End: 2020-11-20

## 2020-07-23 NOTE — Discharge Instructions (Signed)
Stop your BP medications.  Start Labetalol.  We will call in the AM to schedule and [place].  If you worsen, go to the ER.  Take care  Dr. Adriana Simas

## 2020-07-23 NOTE — ED Triage Notes (Signed)
Pt reports she missed a period in January, Positive home pregnancy test on Jan 19th. Soon after, she started with spotting. Today the bleeding is heavier. No clots. Mild cramping.

## 2020-07-24 ENCOUNTER — Telehealth: Payer: Self-pay | Admitting: Emergency Medicine

## 2020-07-24 ENCOUNTER — Ambulatory Visit
Admission: RE | Admit: 2020-07-24 | Discharge: 2020-07-24 | Disposition: A | Discharge status: Home / Self Care | Payer: BC Managed Care – PPO | Source: Ambulatory Visit | Attending: Family Medicine | Admitting: Family Medicine

## 2020-07-24 ENCOUNTER — Telehealth: Payer: Self-pay

## 2020-07-24 DIAGNOSIS — O209 Hemorrhage in early pregnancy, unspecified: Secondary | ICD-10-CM | POA: Insufficient documentation

## 2020-07-24 NOTE — Telephone Encounter (Signed)
We received a chat message from Dr. Adriana Simas asking to get a patient in urgently at the [facility] clinic for vaginal bleeding, positive pregnancy test, US shows no evidence of uterine pregnancy. I asked if this could wait until Monday as it would be nearly impossible to get her in today. He said Monday would be fine. I called [facility] and they did not have any availability for today, but can see her on Monday 7th @ 2:30 in the [place] office. They will need the doctor, who saw the pt, to send records. I chatted with Dr Adriana Simas again, and let him know about the appt and to fax his notes to [phone]. Dr Yetta Barre also wanted him or his nurse to direct the patient on what to do if she gets worse over the weekend. I also put this in the chat message. Dr Adriana Simas ended message with thank you.

## 2020-07-24 NOTE — Telephone Encounter (Signed)
Ultrasound scheduled for today. Pt to head over to [facility] now.

## 2020-07-25 NOTE — ED Provider Notes (Signed)
[facility]    CSN: [id number] Arrival date & time: 07/23/20  1840  History   Chief Complaint Chief Complaint  Patient presents with  . Vaginal Bleeding   HPI  35 year old female presents with the above complaint.  Patient reports that her LMP was 12/18. She states that she had a positive home pregnancy test on 1/19. Reports spotting for the past 2 weeks. Heavier flow today. Mild cramping. No reports of passage of tissue. She states that despite her positive pregnancy test, she has been taking her home BP medications (not recommended in pregnancy). No fever. Pain currently 2/10 in severity. She is concerned she is having a miscarriage.   Past Medical History:  Diagnosis Date  . Asthma   . Hypertension     Patient Active Problem List   Diagnosis Date Noted  . Acute respiratory failure with hypoxia (HCC) 11/03/2015  . HTN (hypertension), benign 11/03/2015  . Asthma exacerbation 04/01/2015    Past Surgical History:  Procedure Laterality Date  . CESAREAN SECTION     x 1    OB History   No obstetric history on file.      Home Medications    Prior to Admission medications   Medication Sig Start Date End Date Taking? Authorizing Provider  labetalol (NORMODYNE) 100 MG tablet Take 1 tablet (100 mg total) by mouth 2 (two) times daily. 07/23/20  Yes Breeonna Mone G, DO  albuterol (VENTOLIN HFA) 108 (90 Base) MCG/ACT inhaler Inhale 2 puffs into the lungs every 6 (six) hours as needed for wheezing or shortness of breath. 11/14/19   Duanne Limerick, MD  TRELEGY ELLIPTA 200-62.5-25 MCG/INH AEPB pulm 05/04/20   [provider]  amLODipine (NORVASC) 5 MG tablet Take 1 tablet (5 mg total) by mouth daily. 05/08/20 07/23/20  Duanne Limerick, MD  lisinopril-hydrochlorothiazide (ZESTORETIC) 20-25 MG tablet Take 1 tablet by mouth daily. 05/08/20 07/23/20  Duanne Limerick, MD    Family History Family History  Problem Relation Age of Onset  . Cervical cancer Mother   .  Hypertension Father   . Diabetes Maternal Grandmother   . Diabetes Paternal Grandmother     Social History Social History   Tobacco Use  . Smoking status: Former Smoker    Quit date: 04/12/2018    Years since quitting: 2.2  . Smokeless tobacco: Never Used  Vaping Use  . Vaping Use: Never used  Substance Use Topics  . Alcohol use: Yes    Comment: rare  . Drug use: Never     Allergies   Patient has no known allergies.   Review of Systems Review of Systems Per HPI  Physical Exam Triage Vital Signs ED Triage Vitals  Enc Vitals Group     BP 07/23/20 1905 (S) (!) 196/124     Pulse Rate 07/23/20 1905 (!) 110     Resp 07/23/20 1905 19     Temp 07/23/20 1905 98.9 F (37.2 C)     Temp Source 07/23/20 1905 Oral     SpO2 07/23/20 1905 99 %     Weight 07/23/20 1900 212 lb (96.2 kg)     Height 07/23/20 1900 5\' 2"  (1.575 m)     Head Circumference --      Peak Flow --      Pain Score 07/23/20 1859 3     Pain Loc --      Pain Edu? --      Excl. in GC? --  No data found.  Updated Vital Signs BP (S) (!) 196/124 (BP Location: Left Arm) Comment: SKIPPED HER B/P MEDS TODAY  Pulse (!) 110   Temp 98.9 F (37.2 C) (Oral)   Resp 19   Ht 5\' 2"  (1.575 m)   Wt 96.2 kg   LMP 07/09/2020   SpO2 99%   BMI 38.78 kg/m   Visual Acuity Right Eye Distance:   Left Eye Distance:   Bilateral Distance:    Right Eye Near:   Left Eye Near:    Bilateral Near:     Physical Exam Constitutional:      General: She is not in acute distress.    Appearance: Normal appearance. She is not ill-appearing.  HENT:     Head: Normocephalic and atraumatic.  Cardiovascular:     Rate and Rhythm: Normal rate and regular rhythm.  Pulmonary:     Effort: Pulmonary effort is normal.     Breath sounds: Normal breath sounds.  Abdominal:     General: There is no distension.     Palpations: Abdomen is soft.     Tenderness: There is no abdominal tenderness.  Neurological:     Mental Status: She is  alert.  Psychiatric:        Mood and Affect: Mood normal.        Behavior: Behavior normal.    UC Treatments / Results  Labs (all labs ordered are listed, but only abnormal results are displayed) Labs Reviewed  HCG, QUANTITATIVE, PREGNANCY - Abnormal; Notable for the following components:      Result Value   hCG, Beta Chain, Quant, S 1,545 (*)    All other components within normal limits    EKG   Radiology 07/11/2020 OB LESS THAN 14 WEEKS WITH OB TRANSVAGINAL  Result Date: 07/24/2020 CLINICAL DATA:  Vaginal bleeding for 2 weeks EXAM: OBSTETRIC <14 WK 09/21/2020 AND TRANSVAGINAL OB US TECHNIQUE: Both transabdominal and transvaginal ultrasound examinations were performed for complete evaluation of the gestation as well as the maternal uterus, adnexal regions, and pelvic cul-de-sac. Transvaginal technique was performed to assess early pregnancy. COMPARISON:  None. FINDINGS: Intrauterine gestational sac: None Yolk sac:  Not Visualized. Embryo:  Not Visualized. LMP: 06/06/20. Gestational age by LMP is 6 weeks 6 days. EDC by LMP is 03/13/21. Right ovary: Suboptimally visualized. It measures 2.4 x 2.9 x 3.4 cm. Left ovary: Suboptimally visualized. It measures 2.2 x 2.0 x 2.7 cm. Other :There is a posterior fibroid which measures 1.5 x 1.2 x 1.3 cm. Free fluid:  None IMPRESSION: No intrauterine gestational sac, yolk sac, or fetal pole identified. In the setting of positive pregnancy test and no definite intrauterine pregnancy, this reflects a pregnancy of unknown location. Differential considerations include early normal IUP, abnormal IUP, or nonvisualized ectopic pregnancy. Differentiation is achieved with serial beta HCG supplemented by repeat sonography as clinically warranted. Electronically Signed   By: 03/15/21 MD   On: 07/24/2020 11:27    Procedures Procedures (including critical care time)  Medications Ordered in UC Medications - No data to display  Initial Impression / Assessment and Plan /  UC Course  I have reviewed the triage vital signs and the nursing notes.  Pertinent labs & imaging results that were available during my care of the patient were reviewed by me and considered in my medical decision making (see chart for details).    35 year old female presents with vaginal bleeding in the setting of a positive home pregnancy test. hCG  1545 today. [place] was obtained and revealed no evidence of IUP or ectopic. Suspect spontaneous abortion. Needs serial hCG testing. Patient schedule to see [facility] on Monday. Additionally, I stopped patients BP meds and started her on Labetalol.   Final Clinical Impressions(s) / UC Diagnoses   Final diagnoses:  Vaginal bleeding in pregnancy  Pre-existing essential hypertension during pregnancy in first trimester     Discharge Instructions     Stop your BP medications.  Start Labetalol.  We will call in the AM to schedule and [place].  If you worsen, go to the ER.  Take care  Dr. Adriana Simas    ED Prescriptions    Medication Sig Dispense Auth. Provider   labetalol (NORMODYNE) 100 MG tablet Take 1 tablet (100 mg total) by mouth 2 (two) times daily. 60 tablet Everlene Other G, DO     PDMP not reviewed this encounter.   Tommie Sams, [place] 07/25/20 1047

## 2020-07-27 ENCOUNTER — Ambulatory Visit: Payer: Self-pay | Admitting: Obstetrics and Gynecology

## 2020-10-22 ENCOUNTER — Ambulatory Visit: Payer: BC Managed Care – PPO | Admitting: Family Medicine

## 2020-11-20 ENCOUNTER — Other Ambulatory Visit: Payer: Self-pay

## 2020-11-20 ENCOUNTER — Encounter: Payer: Self-pay | Admitting: Family Medicine

## 2020-11-20 ENCOUNTER — Ambulatory Visit (INDEPENDENT_AMBULATORY_CARE_PROVIDER_SITE_OTHER): Payer: BC Managed Care – PPO | Admitting: Family Medicine

## 2020-11-20 VITALS — BP 120/74 | HR 88 | Ht 62.0 in | Wt 215.0 lb

## 2020-11-20 DIAGNOSIS — E6609 Other obesity due to excess calories: Secondary | ICD-10-CM | POA: Diagnosis not present

## 2020-11-20 DIAGNOSIS — I10 Essential (primary) hypertension: Secondary | ICD-10-CM

## 2020-11-20 DIAGNOSIS — J4520 Mild intermittent asthma, uncomplicated: Secondary | ICD-10-CM | POA: Diagnosis not present

## 2020-11-20 MED ORDER — LISINOPRIL-HYDROCHLOROTHIAZIDE 20-25 MG PO TABS: 1.0000 | ORAL_TABLET | Freq: Every day | ORAL | 1 refills | Status: DC

## 2020-11-20 MED ORDER — AMLODIPINE BESYLATE 5 MG PO TABS: 5.0000 mg | ORAL_TABLET | Freq: Every day | ORAL | 1 refills | Status: DC

## 2020-11-20 MED ORDER — ALBUTEROL SULFATE HFA 108 (90 BASE) MCG/ACT IN AERS: 2.0000 | INHALATION_SPRAY | Freq: Four times a day (QID) | RESPIRATORY_TRACT | 11 refills | Status: DC | PRN

## 2020-11-20 NOTE — Progress Notes (Signed)
Date:  11/20/2020   Name:  Jessica Humphrey   DOB:  [birth year]   MRN:  [id number]   Chief Complaint: Hypertension  Hypertension This is a chronic problem. The current episode started more than 1 year ago. The problem has been gradually improving since onset. The problem is controlled. Pertinent negatives include no anxiety, blurred vision, chest pain, headaches, malaise/fatigue, neck pain, orthopnea, palpitations, peripheral edema, PND, shortness of breath or sweats. There are no associated agents to hypertension. Past treatments include ACE inhibitors and calcium channel blockers. The current treatment provides moderate improvement. There are no compliance problems.  There is no history of angina, kidney disease, CAD/MI, CVA, heart failure, left ventricular hypertrophy, PVD or retinopathy. There is no history of chronic renal disease, a hypertension causing med or renovascular disease.    Lab Results  Component Value Date   CREATININE 0.81 05/08/2020   BUN 15 05/08/2020   NA 140 05/08/2020   K 3.9 05/08/2020   CL 99 05/08/2020   CO2 24 05/08/2020   No results found for: CHOL, HDL, LDLCALC, LDLDIRECT, TRIG, CHOLHDL No results found for: TSH Lab Results  Component Value Date   HGBA1C 5.5 03/31/2015   Lab Results  Component Value Date   WBC 11.9 (H) 11/04/2015   HGB 12.0 11/04/2015   HCT 37.2 11/04/2015   MCV 82.0 11/04/2015   PLT 410 11/04/2015   No results found for: ALT, AST, GGT, ALKPHOS, BILITOT   Review of Systems  Constitutional: Negative for chills, fever and malaise/fatigue.  HENT: Negative for drooling, ear discharge, ear pain and sore throat.   Eyes: Negative for blurred vision.  Respiratory: Negative for cough, shortness of breath and wheezing.   Cardiovascular: Negative for chest pain, palpitations, orthopnea, leg swelling and PND.  Gastrointestinal: Negative for abdominal pain, blood in stool, constipation, diarrhea and nausea.  Endocrine: Negative for  polydipsia.  Genitourinary: Negative for dysuria, frequency, hematuria and urgency.  Musculoskeletal: Negative for back pain, myalgias and neck pain.  Skin: Negative for rash.  Allergic/Immunologic: Negative for environmental allergies.  Neurological: Negative for dizziness and headaches.  Hematological: Does not bruise/bleed easily.  Psychiatric/Behavioral: Negative for suicidal ideas. The patient is not nervous/anxious.     Patient Active Problem List   Diagnosis Date Noted  . Acute respiratory failure with hypoxia (HCC) 11/03/2015  . HTN (hypertension), benign 11/03/2015  . Asthma exacerbation 04/01/2015    No Known Allergies  Past Surgical History:  Procedure Laterality Date  . CESAREAN SECTION     x 1    Social History   Tobacco Use  . Smoking status: Former Smoker    Quit date: 04/12/2018    Years since quitting: 2.6  . Smokeless tobacco: Never Used  Vaping Use  . Vaping Use: Never used  Substance Use Topics  . Alcohol use: Yes    Comment: rare  . Drug use: Never     Medication list has been reviewed and updated.  Current Meds  Medication Sig  . albuterol (VENTOLIN HFA) 108 (90 Base) MCG/ACT inhaler Inhale 2 puffs into the lungs every 6 (six) hours as needed for wheezing or shortness of breath.  Harrel Carina ELLIPTA 200-62.5-25 MCG/INH AEPB pulm  . [DISCONTINUED] amLODipine (NORVASC) 5 MG tablet Take 1 tablet (5 mg total) by mouth daily.  . [DISCONTINUED] labetalol (NORMODYNE) 100 MG tablet Take 1 tablet (100 mg total) by mouth 2 (two) times daily.  . [DISCONTINUED] lisinopril-hydrochlorothiazide (ZESTORETIC) 20-25 MG tablet Take 1 tablet by  mouth daily.    PHQ 2/9 Scores 05/08/2020 11/14/2019 05/06/2019  PHQ - 2 Score 0 0 0  PHQ- 9 Score 2 2 0    GAD 7 : Generalized Anxiety Score 05/08/2020 11/14/2019 05/06/2019  Nervous, Anxious, on Edge 0 0 0  Control/stop worrying 0 0 0  Worry too much - different things 0 0 0  Trouble relaxing 2 1 0  Restless 0 0 0   Easily annoyed or irritable 0 0 0  Afraid - awful might happen 0 0 0  Total GAD 7 Score 2 1 0  Anxiety Difficulty Not difficult at all Not difficult at all -    BP Readings from Last 3 Encounters:  11/20/20 120/74  07/23/20 (S) (!) 196/124  05/08/20 (!) 120/94    Physical Exam Vitals and nursing note reviewed.  Constitutional:      Appearance: She is well-developed.  HENT:     Head: Normocephalic.     Right Ear: Tympanic membrane and external ear normal.     Left Ear: Tympanic membrane and external ear normal.     Nose: Nose normal. No congestion or rhinorrhea.     Mouth/Throat:     Mouth: Mucous membranes are moist.  Eyes:     General: Lids are everted, no foreign bodies appreciated. No scleral icterus.       Left eye: No foreign body or hordeolum.     Conjunctiva/sclera: Conjunctivae normal.     Right eye: Right conjunctiva is not injected.     Left eye: Left conjunctiva is not injected.     Pupils: Pupils are equal, round, and reactive to light.  Neck:     Thyroid: No thyromegaly.     Vascular: No JVD.     Trachea: No tracheal deviation.  Cardiovascular:     Rate and Rhythm: Normal rate and regular rhythm.     Heart sounds: Normal heart sounds, S1 normal and S2 normal. No murmur heard.  No systolic murmur is present.  No diastolic murmur is present. No friction rub. No gallop. No S3 or S4 sounds.   Pulmonary:     Effort: Pulmonary effort is normal. No respiratory distress.     Breath sounds: Examination of the right-upper field reveals wheezing. Examination of the left-upper field reveals wheezing. Examination of the right-middle field reveals wheezing. Examination of the left-middle field reveals wheezing. Examination of the right-lower field reveals wheezing. Examination of the left-lower field reveals wheezing. Wheezing present. No decreased breath sounds, rhonchi or rales.  Abdominal:     General: Bowel sounds are normal.     Palpations: Abdomen is soft. There is  no mass.     Tenderness: There is no abdominal tenderness. There is no guarding or rebound.  Musculoskeletal:        General: No tenderness. Normal range of motion.     Cervical back: Normal range of motion and neck supple.     Right lower leg: No edema.     Left lower leg: No edema.  Lymphadenopathy:     Cervical: No cervical adenopathy.  Skin:    General: Skin is warm.     Findings: No rash.  Neurological:     Mental Status: She is alert and oriented to person, place, and time.     Cranial Nerves: No cranial nerve deficit.     Deep Tendon Reflexes: Reflexes normal.  Psychiatric:        Mood and Affect: Mood is not anxious or depressed.  Wt Readings from Last 3 Encounters:  11/20/20 215 lb (97.5 kg)  07/23/20 212 lb (96.2 kg)  05/08/20 218 lb (98.9 kg)    BP 120/74   Pulse 88   Ht 5\' 2"  (1.575 m)   Wt 215 lb (97.5 kg)   LMP 10/30/2020 (Exact Date)   BMI 39.32 kg/m   Assessment and Plan:  1. Essential hypertension Chronic.  Controlled.  Stable.  Blood pressure 120/74.  Continue amlodipine 5 mg once a day and lisinopril 20-25 mg once a day.  Patient states that she is not trying to get pregnant and is currently not sexually active.  I have discussed with her the importance of using contraception should the circumstances arise again. - amLODipine (NORVASC) 5 MG tablet; Take 1 tablet (5 mg total) by mouth daily.  Dispense: 90 tablet; Refill: 1 - lisinopril-hydrochlorothiazide (ZESTORETIC) 20-25 MG tablet; Take 1 tablet by mouth daily.  Dispense: 90 tablet; Refill: 1  2. Mild intermittent asthma, unspecified whether complicated Chronic.  Uncontrolled.  Relatively stable.  Patient has diffuse wheezes and on questioning she was wondering if she needs to go back to see her pulmonologist for her Trelegy to which the answer to that is yes.  And then when questioning if she has been using her albuterol she says I only use it when I need it which is infrequent and I have also said  is important that she uses her short acting albuterol daily along with her long-acting bronchodilator and her Trelegy.  Patient seems to understand this now and will resume her albuterol on a 2-3 times a day basis and this has been refilled and she has been encouraged to check in with her pulmonologist as well. - albuterol (VENTOLIN HFA) 108 (90 Base) MCG/ACT inhaler; Inhale 2 puffs into the lungs every 6 (six) hours as needed for wheezing or shortness of breath.  Dispense: 18 g; Refill: 11  3. Class 1 obesity due to excess calories in adult, unspecified BMI, unspecified whether serious comorbidity present Health risks of being over weight were discussed and patient was counseled on weight loss options and exercise.  Patient has been given a Mediterranean diet for weight loss.

## 2020-11-20 NOTE — Patient Instructions (Signed)

## 2021-05-07 ENCOUNTER — Ambulatory Visit: Payer: BC Managed Care – PPO | Admitting: Family Medicine

## 2021-05-26 ENCOUNTER — Ambulatory Visit: Payer: BC Managed Care – PPO | Admitting: Family Medicine

## 2021-06-07 ENCOUNTER — Other Ambulatory Visit: Payer: Self-pay

## 2021-06-07 ENCOUNTER — Ambulatory Visit (INDEPENDENT_AMBULATORY_CARE_PROVIDER_SITE_OTHER): Payer: BC Managed Care – PPO | Admitting: Family Medicine

## 2021-06-07 ENCOUNTER — Encounter: Payer: Self-pay | Admitting: Family Medicine

## 2021-06-07 VITALS — BP 150/110 | HR 80 | Ht 62.0 in | Wt 211.0 lb

## 2021-06-07 DIAGNOSIS — I10 Essential (primary) hypertension: Secondary | ICD-10-CM

## 2021-06-07 MED ORDER — AMLODIPINE BESYLATE 2.5 MG PO TABS: 2.5000 mg | ORAL_TABLET | Freq: Every day | ORAL | 1 refills | Status: DC

## 2021-06-07 MED ORDER — LISINOPRIL-HYDROCHLOROTHIAZIDE 20-25 MG PO TABS: 1.0000 | ORAL_TABLET | Freq: Every day | ORAL | 1 refills | Status: DC

## 2021-06-07 NOTE — Progress Notes (Signed)
Date:  06/07/2021   Name:  Jessica Humphrey   DOB:  02/29/1986   MRN:  [id number]   Chief Complaint: Hypertension (Stopped the amlodipine because it would make her "light-headed" when taking it with the lisinopril)  Hypertension This is a chronic problem. The current episode started more than 1 year ago. The problem has been gradually improving since onset. The problem is controlled. Pertinent negatives include no anxiety, blurred vision, chest pain, headaches, malaise/fatigue, neck pain, orthopnea, palpitations, peripheral edema, PND, shortness of breath or sweats. There are no associated agents to hypertension. Risk factors for coronary artery disease include dyslipidemia. Past treatments include ACE inhibitors and diuretics (not taking amlodipine). The current treatment provides no improvement. Compliance problems include medication side effects.  There is no history of angina, kidney disease, CAD/MI, CVA, heart failure, left ventricular hypertrophy, PVD or retinopathy. There is no history of chronic renal disease, a hypertension causing med or renovascular disease.   Lab Results  Component Value Date   NA 140 05/08/2020   K 3.9 05/08/2020   CO2 24 05/08/2020   GLUCOSE 96 05/08/2020   BUN 15 05/08/2020   CREATININE 0.81 05/08/2020   CALCIUM 10.1 05/08/2020   GFRNONAA 94 05/08/2020   No results found for: CHOL, HDL, LDLCALC, LDLDIRECT, TRIG, CHOLHDL No results found for: TSH Lab Results  Component Value Date   HGBA1C 5.5 03/31/2015   Lab Results  Component Value Date   WBC 11.9 (H) 11/04/2015   HGB 12.0 11/04/2015   HCT 37.2 11/04/2015   MCV 82.0 11/04/2015   PLT 410 11/04/2015   No results found for: ALT, AST, GGT, ALKPHOS, BILITOT No results found for: 25OHVITD2, 25OHVITD3, VD25OH   Review of Systems  Constitutional:  Negative for chills, fever and malaise/fatigue.  HENT:  Negative for drooling, ear discharge, ear pain and sore throat.   Eyes:  Negative for blurred  vision.  Respiratory:  Negative for cough, shortness of breath and wheezing.   Cardiovascular:  Negative for chest pain, palpitations, orthopnea, leg swelling and PND.  Gastrointestinal:  Negative for abdominal pain, blood in stool, constipation, diarrhea and nausea.  Endocrine: Negative for polydipsia.  Genitourinary:  Negative for dysuria, frequency, hematuria and urgency.  Musculoskeletal:  Negative for back pain, myalgias and neck pain.  Skin:  Negative for rash.  Allergic/Immunologic: Negative for environmental allergies.  Neurological:  Negative for dizziness and headaches.  Hematological:  Does not bruise/bleed easily.  Psychiatric/Behavioral:  Negative for suicidal ideas. The patient is not nervous/anxious.    Patient Active Problem List   Diagnosis Date Noted   Acute respiratory failure with hypoxia ([place]) 11/03/2015   HTN (hypertension), benign 11/03/2015   Asthma exacerbation 04/01/2015    No Known Allergies  Past Surgical History:  Procedure Laterality Date   CESAREAN SECTION     x 1    Social History   Tobacco Use   Smoking status: Former    Types: Cigarettes    Quit date: 04/12/2018    Years since quitting: 3.1   Smokeless tobacco: Never  Vaping Use   Vaping Use: Never used  Substance Use Topics   Alcohol use: Yes    Comment: rare   Drug use: Never     Medication list has been reviewed and updated.  Current Meds  Medication Sig   albuterol (VENTOLIN HFA) 108 (90 Base) MCG/ACT inhaler Inhale 2 puffs into the lungs every 6 (six) hours as needed for wheezing or shortness of breath.  lisinopril-hydrochlorothiazide (ZESTORETIC) 20-25 MG tablet Take 1 tablet by mouth daily.   TRELEGY ELLIPTA 200-62.5-25 MCG/INH AEPB pulm    PHQ 2/9 Scores 06/07/2021 05/08/2020 11/14/2019 05/06/2019  PHQ - 2 Score 0 0 0 0  PHQ- 9 Score - 2 2 0    GAD 7 : Generalized Anxiety Score 05/08/2020 11/14/2019 05/06/2019  Nervous, Anxious, on Edge 0 0 0  Control/stop worrying  0 0 0  Worry too much - different things 0 0 0  Trouble relaxing 2 1 0  Restless 0 0 0  Easily annoyed or irritable 0 0 0  Afraid - awful might happen 0 0 0  Total GAD 7 Score 2 1 0  Anxiety Difficulty Not difficult at all Not difficult at all -    BP Readings from Last 3 Encounters:  06/07/21 (!) 150/110  11/20/20 120/74  07/23/20 (S) (!) 196/124    Physical Exam Vitals and nursing note reviewed.  Constitutional:      General: She is not in acute distress.    Appearance: She is not diaphoretic.  HENT:     Head: Normocephalic and atraumatic.     Right Ear: Tympanic membrane and external ear normal.     Left Ear: Tympanic membrane and external ear normal.     Nose: No congestion or rhinorrhea.  Eyes:     General:        Right eye: No discharge.        Left eye: No discharge.     Conjunctiva/sclera: Conjunctivae normal.     Pupils: Pupils are equal, round, and reactive to light.  Neck:     Thyroid: No thyromegaly.     Vascular: No JVD.  Cardiovascular:     Rate and Rhythm: Normal rate and regular rhythm.     Heart sounds: Normal heart sounds. No murmur heard.   No friction rub. No gallop.  Pulmonary:     Effort: Pulmonary effort is normal.     Breath sounds: Normal breath sounds. No wheezing or rhonchi.  Abdominal:     General: Bowel sounds are normal.     Palpations: Abdomen is soft. There is no mass.     Tenderness: There is no abdominal tenderness. There is no guarding.  Musculoskeletal:        General: Normal range of motion.     Cervical back: Normal range of motion and neck supple.  Lymphadenopathy:     Cervical: No cervical adenopathy.  Skin:    General: Skin is warm and dry.  Neurological:     Mental Status: She is alert.     Deep Tendon Reflexes: Reflexes are normal and symmetric.    Wt Readings from Last 3 Encounters:  06/07/21 211 lb (95.7 kg)  11/20/20 215 lb (97.5 kg)  07/23/20 212 lb (96.2 kg)    BP (!) 150/110    Pulse 80    Ht 5\' 2"  (1.575  m)    Wt 211 lb (95.7 kg)    LMP 05/22/2021 (Exact Date)    BMI 38.59 kg/m   Assessment and Plan:  1. Essential hypertension Chronic.  Disease course development worsening uncontrolled.  Patient decided because she was feeling dizzy after being on a dosing of lisinopril hydrochlorothiazide 20-25 and 5 mg of amlodipine to stop her amlodipine because she thought it was making her dizzy.  However blood pressure returns today 150/110 and is definitely out of control and I am trying to explain the patient the importance of keeping  it under 140 and getting it under 90 diastolic.  I will continue her lisinopril hydrochlorothiazide at current dosing but will add back to amlodipine 2.5 mg and will recheck in 6 weeks and then decide whether we need to go back up to 5 whether were going to be able to continue with 2.5 mg daily.  We will not do lab work today and that blood pressure is elevated and would like readings at a normal range. - lisinopril-hydrochlorothiazide (ZESTORETIC) 20-25 MG tablet; Take 1 tablet by mouth daily.  Dispense: 90 tablet; Refill: 1 - amLODipine (NORVASC) 2.5 MG tablet; Take 1 tablet (2.5 mg total) by mouth daily.  Dispense: 60 tablet; Refill: 1

## 2021-07-20 ENCOUNTER — Ambulatory Visit (INDEPENDENT_AMBULATORY_CARE_PROVIDER_SITE_OTHER): Payer: BC Managed Care – PPO | Admitting: Family Medicine

## 2021-07-20 ENCOUNTER — Encounter: Payer: Self-pay | Admitting: Family Medicine

## 2021-07-20 ENCOUNTER — Other Ambulatory Visit: Payer: Self-pay

## 2021-07-20 VITALS — BP 130/86 | HR 80 | Ht 62.0 in | Wt 208.0 lb

## 2021-07-20 DIAGNOSIS — Z6838 Body mass index (BMI) 38.0-38.9, adult: Secondary | ICD-10-CM | POA: Diagnosis not present

## 2021-07-20 DIAGNOSIS — I10 Essential (primary) hypertension: Secondary | ICD-10-CM | POA: Diagnosis not present

## 2021-07-20 MED ORDER — AMLODIPINE BESYLATE 2.5 MG PO TABS: 2.5000 mg | ORAL_TABLET | Freq: Every day | ORAL | 1 refills | Status: DC

## 2021-07-20 MED ORDER — LISINOPRIL-HYDROCHLOROTHIAZIDE 20-25 MG PO TABS
1.0000 | ORAL_TABLET | Freq: Every day | ORAL | 1 refills | Status: DC
Start: 2021-07-20 — End: 2022-01-17

## 2021-07-20 NOTE — Progress Notes (Signed)
Date:  07/20/2021   Name:  Jessica Humphrey   DOB:  1986-02-25   MRN:  [id number]   Chief Complaint: Hypertension  Hypertension This is a chronic problem. The current episode started more than 1 year ago. The problem has been gradually improving since onset. The problem is controlled. Pertinent negatives include no anxiety, blurred vision, chest pain, headaches, malaise/fatigue, neck pain, orthopnea, palpitations, peripheral edema, PND, shortness of breath or sweats. There are no associated agents to hypertension. Past treatments include calcium channel blockers, diuretics and ACE inhibitors. The current treatment provides moderate improvement. There is no history of angina, kidney disease, CAD/MI, CVA, heart failure, left ventricular hypertrophy, PVD or retinopathy. There is no history of chronic renal disease, a hypertension causing med or renovascular disease.   Lab Results  Component Value Date   NA 140 05/08/2020   K 3.9 05/08/2020   CO2 24 05/08/2020   GLUCOSE 96 05/08/2020   BUN 15 05/08/2020   CREATININE 0.81 05/08/2020   CALCIUM 10.1 05/08/2020   GFRNONAA 94 05/08/2020   No results found for: CHOL, HDL, LDLCALC, LDLDIRECT, TRIG, CHOLHDL No results found for: TSH Lab Results  Component Value Date   HGBA1C 5.5 03/31/2015   Lab Results  Component Value Date   WBC 11.9 (H) 11/04/2015   HGB 12.0 11/04/2015   HCT 37.2 11/04/2015   MCV 82.0 11/04/2015   PLT 410 11/04/2015   No results found for: ALT, AST, GGT, ALKPHOS, BILITOT No results found for: 25OHVITD2, 25OHVITD3, VD25OH   Review of Systems  Constitutional: Negative.  Negative for chills, fatigue, fever, malaise/fatigue and unexpected weight change.  HENT:  Negative for congestion, ear discharge, ear pain, rhinorrhea, sinus pressure, sneezing and sore throat.   Eyes:  Negative for blurred vision, photophobia, pain, discharge, redness and itching.  Respiratory:  Negative for cough, shortness of breath, wheezing  and stridor.   Cardiovascular:  Negative for chest pain, palpitations, orthopnea and PND.  Gastrointestinal:  Negative for abdominal pain, blood in stool, constipation, diarrhea, nausea and vomiting.  Endocrine: Negative for cold intolerance, heat intolerance, polydipsia, polyphagia and polyuria.  Genitourinary:  Negative for dysuria, flank pain, frequency, hematuria, menstrual problem, pelvic pain, urgency, vaginal bleeding and vaginal discharge.  Musculoskeletal:  Negative for arthralgias, back pain, myalgias and neck pain.  Skin:  Negative for rash.  Allergic/Immunologic: Negative for environmental allergies and food allergies.  Neurological:  Negative for dizziness, weakness, light-headedness, numbness and headaches.  Hematological:  Negative for adenopathy. Does not bruise/bleed easily.  Psychiatric/Behavioral:  Negative for dysphoric mood. The patient is not nervous/anxious.    Patient Active Problem List   Diagnosis Date Noted   Acute respiratory failure with hypoxia ([facility]) 11/03/2015   HTN (hypertension), benign 11/03/2015   Asthma exacerbation 04/01/2015    No Known Allergies  Past Surgical History:  Procedure Laterality Date   CESAREAN SECTION     x 1    Social History   Tobacco Use   Smoking status: Former    Types: Cigarettes    Quit date: 04/12/2018    Years since quitting: 3.2   Smokeless tobacco: Never  Vaping Use   Vaping Use: Never used  Substance Use Topics   Alcohol use: Yes    Comment: rare   Drug use: Never     Medication list has been reviewed and updated.  Current Meds  Medication Sig   albuterol (VENTOLIN HFA) 108 (90 Base) MCG/ACT inhaler Inhale 2 puffs into the lungs every 6 (  six) hours as needed for wheezing or shortness of breath.   amLODipine (NORVASC) 2.5 MG tablet Take 1 tablet (2.5 mg total) by mouth daily.   lisinopril-hydrochlorothiazide (ZESTORETIC) 20-25 MG tablet Take 1 tablet by mouth daily.   TRELEGY ELLIPTA 200-62.5-25 MCG/INH  [facility] pulm    PHQ 2/9 Scores 06/07/2021 05/08/2020 11/14/2019 05/06/2019  PHQ - 2 Score 0 0 0 0  PHQ- 9 Score - 2 2 0    GAD 7 : Generalized Anxiety Score 05/08/2020 11/14/2019 05/06/2019  Nervous, Anxious, on Edge 0 0 0  Control/stop worrying 0 0 0  Worry too much - different things 0 0 0  Trouble relaxing 2 1 0  Restless 0 0 0  Easily annoyed or irritable 0 0 0  Afraid - awful might happen 0 0 0  Total GAD 7 Score 2 1 0  Anxiety Difficulty Not difficult at all Not difficult at all -    BP Readings from Last 3 Encounters:  07/20/21 130/86  06/07/21 (!) 150/110  11/20/20 120/74    Physical Exam Vitals and nursing note reviewed. Exam conducted with a chaperone present.  Constitutional:      General: She is not in acute distress.    Appearance: She is not diaphoretic.  HENT:     Head: Normocephalic and atraumatic.     Right Ear: Tympanic membrane, ear canal and external ear normal.     Left Ear: Tympanic membrane, ear canal and external ear normal.     Nose: Nose normal. No congestion or rhinorrhea.  Eyes:     General:        Right eye: No discharge.        Left eye: No discharge.     Conjunctiva/sclera: Conjunctivae normal.     Pupils: Pupils are equal, round, and reactive to light.  Neck:     Thyroid: No thyromegaly.     Vascular: No JVD.  Cardiovascular:     Rate and Rhythm: Normal rate and regular rhythm.     Heart sounds: Normal heart sounds. No murmur heard.   No friction rub. No gallop.  Pulmonary:     Effort: Pulmonary effort is normal.     Breath sounds: Normal breath sounds. No wheezing or rhonchi.  Abdominal:     General: Bowel sounds are normal.     Palpations: Abdomen is soft. There is no mass.     Tenderness: There is no abdominal tenderness. There is no guarding.  Musculoskeletal:        General: Normal range of motion.     Cervical back: Normal range of motion and neck supple.  Lymphadenopathy:     Cervical: No cervical adenopathy.  Skin:     General: Skin is warm and dry.  Neurological:     Mental Status: She is alert.     Deep Tendon Reflexes: Reflexes are normal and symmetric.    Wt Readings from Last 3 Encounters:  07/20/21 208 lb (94.3 kg)  06/07/21 211 lb (95.7 kg)  11/20/20 215 lb (97.5 kg)    BP 130/86    Pulse 80    Ht 5\' 2"  (1.575 m)    Wt 208 lb (94.3 kg)    LMP 06/19/2021 (Approximate)    BMI 38.04 kg/m   Assessment and Plan:  1. Essential hypertension Chronic.  Controlled.  Stable.  Continue combination of amlodipine 2.5 mg once a day and lisinopril hydrochlorothiazide 20-25 mg once a day.  Will check renal function panel for  GFR and electrolytes.  We will recheck patient in 6 months. - amLODipine (NORVASC) 2.5 MG tablet; Take 1 tablet (2.5 mg total) by mouth daily.  Dispense: 90 tablet; Refill: 1 - lisinopril-hydrochlorothiazide (ZESTORETIC) 20-25 MG tablet; Take 1 tablet by mouth daily.  Dispense: 90 tablet; Refill: 1 - Renal Function Panel  2. BMI 38.0-38.9,adult Chronic.  Questionable control.  Patient has not had lipid panel done and we we will check lipid panel today to see what the status of LDL control is.  In the meantime patient has been given low-cholesterol triglyceride guidelines for weight reduction and cholesterol control. - Lipid Panel With LDL/HDL Ratio

## 2021-07-20 NOTE — Patient Instructions (Signed)

## 2021-07-21 LAB — RENAL FUNCTION PANEL
Albumin: 4.7 g/dL (ref 3.8–4.8)
BUN/Creatinine Ratio: 12 (ref 9–23)
BUN: 9 mg/dL (ref 6–20)
CO2: 23 mmol/L (ref 20–29)
Calcium: 9.6 mg/dL (ref 8.7–10.2)
Chloride: 102 mmol/L (ref 96–106)
Creatinine, Ser: 0.75 mg/dL (ref 0.57–1.00)
Glucose: 101 mg/dL — ABNORMAL HIGH (ref 70–99)
Phosphorus: 3.7 mg/dL (ref 3.0–4.3)
Potassium: 4.2 mmol/L (ref 3.5–5.2)
Sodium: 140 mmol/L (ref 134–144)
eGFR: 105 mL/min/{1.73_m2} (ref >59)

## 2021-07-21 LAB — LIPID PANEL WITH LDL/HDL RATIO
Cholesterol, Total: 196 mg/dL (ref 100–199)
HDL: 64 mg/dL (ref >39)
LDL Chol Calc (NIH): 105 mg/dL — ABNORMAL HIGH (ref 0–99)
LDL/HDL Ratio: 1.6 ratio (ref 0.0–3.2)
Triglycerides: 154 mg/dL — ABNORMAL HIGH (ref 0–149)
VLDL Cholesterol Cal: 27 mg/dL (ref 5–40)

## 2022-01-17 ENCOUNTER — Encounter: Payer: Self-pay | Admitting: Family Medicine

## 2022-01-17 ENCOUNTER — Ambulatory Visit: Payer: BC Managed Care – PPO | Admitting: Family Medicine

## 2022-01-17 ENCOUNTER — Ambulatory Visit (INDEPENDENT_AMBULATORY_CARE_PROVIDER_SITE_OTHER): Payer: BC Managed Care – PPO | Admitting: Family Medicine

## 2022-01-17 VITALS — BP 130/80 | HR 80 | Ht 62.0 in | Wt 196.0 lb

## 2022-01-17 DIAGNOSIS — I10 Essential (primary) hypertension: Secondary | ICD-10-CM

## 2022-01-17 DIAGNOSIS — R634 Abnormal weight loss: Secondary | ICD-10-CM

## 2022-01-17 DIAGNOSIS — D649 Anemia, unspecified: Secondary | ICD-10-CM | POA: Diagnosis not present

## 2022-01-17 DIAGNOSIS — J4520 Mild intermittent asthma, uncomplicated: Secondary | ICD-10-CM

## 2022-01-17 MED ORDER — ALBUTEROL SULFATE HFA 108 (90 BASE) MCG/ACT IN AERS: 2.0000 | INHALATION_SPRAY | Freq: Four times a day (QID) | RESPIRATORY_TRACT | 11 refills | Status: DC | PRN

## 2022-01-17 MED ORDER — AMLODIPINE BESYLATE 2.5 MG PO TABS: 2.5000 mg | ORAL_TABLET | Freq: Every day | ORAL | 1 refills | Status: DC

## 2022-01-17 MED ORDER — LISINOPRIL-HYDROCHLOROTHIAZIDE 20-25 MG PO TABS: 1.0000 | ORAL_TABLET | Freq: Every day | ORAL | 1 refills | Status: DC

## 2022-01-17 NOTE — Progress Notes (Signed)
Date:  01/17/2022   Name:  Jessica Humphrey   DOB:  18-Jul-1985   MRN:  [id number]   Chief Complaint: Hypertension, Asthma, and Hyperglycemia  Hypertension This is a chronic problem. The current episode started more than 1 year ago. The problem has been gradually improving since onset. The problem is controlled. Pertinent negatives include no blurred vision, chest pain, headaches, malaise/fatigue, orthopnea, palpitations, peripheral edema, PND or shortness of breath. There are no known risk factors for coronary artery disease. Past treatments include ACE inhibitors, diuretics and calcium channel blockers. The current treatment provides moderate improvement. There are no compliance problems.  There is no history of angina, kidney disease, CAD/MI, CVA, heart failure, left ventricular hypertrophy, PVD or retinopathy. There is no history of chronic renal disease, a hypertension causing med or renovascular disease.  Asthma She complains of frequent throat clearing. There is no chest tightness, cough, difficulty breathing, hoarse voice, shortness of breath or wheezing. This is a chronic problem. The current episode started more than 1 year ago. The problem occurs intermittently. The problem has been gradually improving. Pertinent negatives include no chest pain, fever, headaches, malaise/fatigue or PND. Her symptoms are alleviated by beta-agonist, ipratropium and steroid inhaler (Treledgy). She reports moderate improvement on treatment. Her past medical history is significant for asthma. There is no history of COPD or pneumonia.  Hyperglycemia Pertinent negatives include no chest pain, coughing, fatigue, fever or headaches.    Lab Results  Component Value Date   NA 140 07/20/2021   K 4.2 07/20/2021   CO2 23 07/20/2021   GLUCOSE 101 (H) 07/20/2021   BUN 9 07/20/2021   CREATININE 0.75 07/20/2021   CALCIUM 9.6 07/20/2021   EGFR 105 07/20/2021   GFRNONAA 94 05/08/2020   Lab Results  Component  Value Date   CHOL 196 07/20/2021   HDL 64 07/20/2021   LDLCALC 105 (H) 07/20/2021   TRIG 154 (H) 07/20/2021   No results found for: "TSH" Lab Results  Component Value Date   HGBA1C 5.5 03/31/2015   Lab Results  Component Value Date   WBC 11.9 (H) 11/04/2015   HGB 12.0 11/04/2015   HCT 37.2 11/04/2015   MCV 82.0 11/04/2015   PLT 410 11/04/2015   No results found for: "ALT", "AST", "GGT", "ALKPHOS", "BILITOT" No results found for: "25OHVITD2", "25OHVITD3", "VD25OH"   Review of Systems  Constitutional:  Positive for unexpected weight change. Negative for fatigue, fever and malaise/fatigue.  HENT:  Negative for hoarse voice.   Eyes:  Negative for blurred vision.  Respiratory:  Negative for cough, choking, shortness of breath and wheezing.   Cardiovascular:  Negative for chest pain, palpitations, orthopnea and PND.  Neurological:  Negative for headaches.    Patient Active Problem List   Diagnosis Date Noted   HTN (hypertension), benign 11/03/2015   Asthma exacerbation 04/01/2015    No Known Allergies  Past Surgical History:  Procedure Laterality Date   CESAREAN SECTION     x 1    Social History   Tobacco Use   Smoking status: Former    Types: Cigarettes    Quit date: 04/12/2018    Years since quitting: 3.7   Smokeless tobacco: Never  Vaping Use   Vaping Use: Never used  Substance Use Topics   Alcohol use: Yes    Comment: rare   Drug use: Never     Medication list has been reviewed and updated.  Current Meds  Medication Sig   albuterol (VENTOLIN HFA)  108 (90 Base) MCG/ACT inhaler Inhale 2 puffs into the lungs every 6 (six) hours as needed for wheezing or shortness of breath.   amLODipine (NORVASC) 2.5 MG tablet Take 1 tablet (2.5 mg total) by mouth daily.   lisinopril-hydrochlorothiazide (ZESTORETIC) 20-25 MG tablet Take 1 tablet by mouth daily.   TRELEGY ELLIPTA 200-62.5-25 MCG/INH [facility] pulm       01/17/2022    3:06 PM 05/08/2020   10:22 AM  11/14/2019    8:09 AM 05/06/2019    2:26 PM  GAD 7 : Generalized Anxiety Score  Nervous, Anxious, on Edge 3 0 0 0  Control/stop worrying 2 0 0 0  Worry too much - different things 2 0 0 0  Trouble relaxing 2 2 1  0  Restless 2 0 0 0  Easily annoyed or irritable 2 0 0 0  Afraid - awful might happen 1 0 0 0  Total GAD 7 Score 14 2 1  0  Anxiety Difficulty Somewhat difficult Not difficult at all Not difficult at all        01/17/2022    3:06 PM 06/07/2021    1:41 PM 05/08/2020   10:21 AM  Depression screen PHQ 2/9  Decreased Interest 0 0 0  Down, Depressed, Hopeless 0 0 0  PHQ - 2 Score 0 0 0  Altered sleeping 2  1  Tired, decreased energy 2  1  Change in appetite 0  0  Feeling bad or failure about yourself  0  0  Trouble concentrating 1  0  Moving slowly or fidgety/restless 0  0  Suicidal thoughts 0  0  PHQ-9 Score 5  2  Difficult doing work/chores Not difficult at all  Not difficult at all    BP Readings from Last 3 Encounters:  01/17/22 130/80  07/20/21 130/86  06/07/21 (!) 150/110    Physical Exam Vitals and nursing note reviewed.  Constitutional:      Appearance: She is well-developed.  HENT:     Head: Normocephalic.     Right Ear: Tympanic membrane and external ear normal.     Left Ear: Tympanic membrane and external ear normal.     Nose: Nose normal.     Mouth/Throat:     Mouth: Mucous membranes are moist.  Eyes:     General: Lids are everted, no foreign bodies appreciated. No scleral icterus.       Left eye: No foreign body or hordeolum.     Conjunctiva/sclera: Conjunctivae normal.     Right eye: Right conjunctiva is not injected.     Left eye: Left conjunctiva is not injected.     Pupils: Pupils are equal, round, and reactive to light.  Neck:     Thyroid: No thyromegaly.     Vascular: No JVD.     Trachea: No tracheal deviation.  Cardiovascular:     Rate and Rhythm: Normal rate and regular rhythm.     Heart sounds: Normal heart sounds. No murmur  heard.    No friction rub. No gallop.  Pulmonary:     Effort: Pulmonary effort is normal. No respiratory distress.     Breath sounds: Normal breath sounds. No wheezing or rales.  Abdominal:     General: Bowel sounds are normal.     Palpations: Abdomen is soft. There is no mass.     Tenderness: There is no abdominal tenderness. There is no guarding or rebound.  Musculoskeletal:        General: No tenderness.  Normal range of motion.     Cervical back: Normal range of motion and neck supple.  Lymphadenopathy:     Cervical: No cervical adenopathy.  Skin:    General: Skin is warm.     Findings: No rash.  Neurological:     Mental Status: She is alert and oriented to person, place, and time.     Cranial Nerves: No cranial nerve deficit.     Deep Tendon Reflexes: Reflexes normal.  Psychiatric:        Mood and Affect: Mood is not anxious or depressed.     Wt Readings from Last 3 Encounters:  01/17/22 196 lb (88.9 kg)  07/20/21 208 lb (94.3 kg)  06/07/21 211 lb (95.7 kg)    BP 130/80   Pulse 80   Ht 5' 2"  (1.575 m)   Wt 196 lb (88.9 kg)   BMI 35.85 kg/m   Assessment and Plan:  1. Essential hypertension Chronic.  Controlled.  Stable.  Blood pressure today is 130/80.  Tolerating medications well asymptomatic.  Continue amlodipine 2.5 mg and lisinopril hydrochlorothiazide 20-25 mg once a day.  Will check renal function panel. - amLODipine (NORVASC) 2.5 MG tablet; Take 1 tablet (2.5 mg total) by mouth daily.  Dispense: 90 tablet; Refill: 1 - lisinopril-hydrochlorothiazide (ZESTORETIC) 20-25 MG tablet; Take 1 tablet by mouth daily.  Dispense: 90 tablet; Refill: 1 - Renal Function Panel  2. Mild intermittent asthma, unspecified whether complicated Chronic.  Controlled.  Stable.  Patient is on Trelegy once a day and albuterol 2 puffs every 6 hours as needed for wheezing or shortness of breath I have encouraged that she take on a more regular basis during the heat related 2 puffs every  6 hours as needed for wheezing or shortness of breath I have encouraged that she take on a more regular basis during the heat related weather. - albuterol (VENTOLIN HFA) 108 (90 Base) MCG/ACT inhaler; Inhale 2 puffs into the lungs every 6 (six) hours as needed for wheezing or shortness of breath.  Dispense: 18 g; Refill: 11  3. Weight loss, unintentional New onset noted there is a 15 pound weight loss with really no specific dietary approach.  We will check an A1c CBC renal function panel and TSH as to etiology of unintentional weight loss of 15 pounds. - HgB A1c - CBC with Differential/Platelet - Renal Function Panel - TSH

## 2022-01-18 ENCOUNTER — Ambulatory Visit: Payer: Self-pay | Admitting: *Deleted

## 2022-01-18 LAB — CBC WITH DIFFERENTIAL/PLATELET
Basophils Absolute: 0.1 10*3/uL (ref 0.0–0.2)
Basos: 1 %
EOS (ABSOLUTE): 0.6 10*3/uL — ABNORMAL HIGH (ref 0.0–0.4)
Eos: 7 %
Hematocrit: 32.8 % — ABNORMAL LOW (ref 34.0–46.6)
Hemoglobin: 9.9 g/dL — ABNORMAL LOW (ref 11.1–15.9)
Immature Grans (Abs): 0 10*3/uL (ref 0.0–0.1)
Immature Granulocytes: 0 %
Lymphocytes Absolute: 2.3 10*3/uL (ref 0.7–3.1)
Lymphs: 27 %
MCH: 22.4 pg — ABNORMAL LOW (ref 26.6–33.0)
MCHC: 30.2 g/dL — ABNORMAL LOW (ref 31.5–35.7)
MCV: 74 fL — ABNORMAL LOW (ref 79–97)
Monocytes Absolute: 0.5 10*3/uL (ref 0.1–0.9)
Monocytes: 6 %
Neutrophils Absolute: 4.8 10*3/uL (ref 1.4–7.0)
Neutrophils: 59 %
Platelets: 519 10*3/uL — ABNORMAL HIGH (ref 150–450)
RBC: 4.41 x10E6/uL (ref 3.77–5.28)
RDW: 17.4 % — ABNORMAL HIGH (ref 11.7–15.4)
WBC: 8.3 10*3/uL (ref 3.4–10.8)

## 2022-01-18 LAB — HEMOGLOBIN A1C
Est. average glucose Bld gHb Est-mCnc: 120 mg/dL
Hgb A1c MFr Bld: 5.8 % — ABNORMAL HIGH (ref 4.8–5.6)

## 2022-01-18 LAB — RENAL FUNCTION PANEL
Albumin: 4.6 g/dL (ref 3.9–4.9)
BUN/Creatinine Ratio: 22 (ref 9–23)
BUN: 17 mg/dL (ref 6–20)
CO2: 22 mmol/L (ref 20–29)
Calcium: 10.2 mg/dL (ref 8.7–10.2)
Chloride: 100 mmol/L (ref 96–106)
Creatinine, Ser: 0.79 mg/dL (ref 0.57–1.00)
Glucose: 97 mg/dL (ref 70–99)
Phosphorus: 4.2 mg/dL (ref 3.0–4.3)
Potassium: 4.4 mmol/L (ref 3.5–5.2)
Sodium: 137 mmol/L (ref 134–144)
eGFR: 98 mL/min/{1.73_m2} (ref >59)

## 2022-01-18 LAB — TSH: TSH: 1.78 u[IU]/mL (ref 0.450–4.500)

## 2022-01-18 NOTE — Telephone Encounter (Signed)
Pt returned call and was given the lab result message from Dr. Elizabeth Sauer dated 01/18/2022 at 7:25 AM.  No questions from pt.     Dr. Yetta Barre mentioned "Acquire serum ferritin".  Order need to be ordered?  Wasn't sure what she wanted done with this.  Regarding her menstrual cycle she started period on Sunday.   First 3 days it's spotty then by 3rd day it's my "normal".    Denies heavy bleeding when I asked her.  Pt needs to be called back regarding the serum ferritin mentioned above.  Number in chart is correct.   Reason for Disposition  [1] Follow-up call to recent contact AND [2] information only call, no triage required  Answer Assessment - Initial Assessment Questions 1. REASON FOR CALL or QUESTION: "What is your reason for calling today?" or "How can I best help you?" or "What question do you have that I can help answer?"     Lab results given  Protocols used: Information Only Call - No Triage-A-AH

## 2022-01-19 ENCOUNTER — Other Ambulatory Visit: Payer: Self-pay

## 2022-01-19 DIAGNOSIS — D649 Anemia, unspecified: Secondary | ICD-10-CM

## 2022-01-19 DIAGNOSIS — N920 Excessive and frequent menstruation with regular cycle: Secondary | ICD-10-CM

## 2022-01-19 LAB — SPECIMEN STATUS REPORT

## 2022-01-19 LAB — FERRITIN: Ferritin: 9 ng/mL — ABNORMAL LOW (ref 15–150)

## 2022-01-19 MED ORDER — IRON (FERROUS SULFATE) 325 (65 FE) MG PO TABS
325.0000 mg | ORAL_TABLET | Freq: Every day | ORAL | 5 refills | Status: DC
Start: 2022-01-19 — End: 2022-11-07

## 2022-01-19 NOTE — Progress Notes (Signed)
Placed ref to [facility]

## 2022-02-17 ENCOUNTER — Ambulatory Visit: Payer: Self-pay | Admitting: Obstetrics & Gynecology

## 2022-02-24 DIAGNOSIS — R0602 Shortness of breath: Secondary | ICD-10-CM | POA: Diagnosis not present

## 2022-02-24 DIAGNOSIS — R0609 Other forms of dyspnea: Secondary | ICD-10-CM | POA: Diagnosis not present

## 2022-02-24 DIAGNOSIS — G4719 Other hypersomnia: Secondary | ICD-10-CM | POA: Diagnosis not present

## 2022-03-04 ENCOUNTER — Ambulatory Visit
Admission: EM | Admit: 2022-03-04 | Discharge: 2022-03-04 | Disposition: A | Discharge status: Home / Self Care | Payer: BC Managed Care – PPO

## 2022-03-04 DIAGNOSIS — R42 Dizziness and giddiness: Secondary | ICD-10-CM

## 2022-03-04 MED ORDER — FLUTICASONE PROPIONATE 50 MCG/ACT NA SUSP: 1.0000 | Freq: Every day | NASAL | 0 refills | Status: DC

## 2022-03-04 MED ORDER — DIMENHYDRINATE 50 MG PO TABS: 50.0000 mg | ORAL_TABLET | Freq: Three times a day (TID) | ORAL | 0 refills | Status: DC | PRN

## 2022-03-04 NOTE — ED Provider Notes (Signed)
[facility]    CSN: [id number] Arrival date & time: 03/04/22  1258      History   Chief Complaint Chief Complaint  Patient presents with   Dizziness    HPI Jessica Humphrey is a 36 y.o. female.   Patient presents with persisting dizziness for 2 weeks.  She had lightheadedness without syncope.  Symptoms of started abruptly while lying in the bed.  This dizziness is described as the room spinning and is worse in with movements such as rolling and bending over.  Symptoms last throughout the day and fluctuate in intensity.  Completed a virtual visit and was diagnosed with vertigo, started on meclizine which has been ineffective.  Has attempted use of a shot of ginger which has been somewhat helpful, does not resolve symptoms but temporarily minimizes.  Denies memory or speech changes, general weakness, visual changes, headaches.  Endorses a sensation of feeling like something is inside the right ear but denies pain, drainage, pruritus.  Endorses congestion at baseline as she has yearly allergies taking Benadryl at nighttime and loratadine throughout the day.   Past Medical History:  Diagnosis Date   Asthma    Hypertension     Patient Active Problem List   Diagnosis Date Noted   HTN (hypertension), benign 11/03/2015   Asthma exacerbation 04/01/2015    Past Surgical History:  Procedure Laterality Date   CESAREAN SECTION     x 1    OB History   No obstetric history on file.      Home Medications    Prior to Admission medications   Medication Sig Start Date End Date Taking? Authorizing Provider  albuterol (VENTOLIN HFA) 108 (90 Base) MCG/ACT inhaler Inhale 2 puffs into the lungs every 6 (six) hours as needed for wheezing or shortness of breath. 01/17/22  Yes Duanne Limerick, MD  amLODipine (NORVASC) 2.5 MG tablet Take 1 tablet (2.5 mg total) by mouth daily. 01/17/22  Yes Duanne Limerick, MD  Iron, Ferrous Sulfate, 325 (65 Fe) MG TABS Take 325 mg by mouth daily.  01/19/22  Yes Duanne Limerick, MD  lisinopril-hydrochlorothiazide (ZESTORETIC) 20-25 MG tablet Take 1 tablet by mouth daily. 01/17/22  Yes Duanne Limerick, MD  TRELEGY ELLIPTA 200-62.5-25 MCG/INH [facility] pulm 05/04/20  Yes [provider]  meclizine (ANTIVERT) 25 MG tablet Take 25 mg by mouth every 6 (six) hours as needed. 02/28/22   [provider]    Family History Family History  Problem Relation Age of Onset   Cervical cancer Mother    Hypertension Father    Diabetes Maternal Grandmother    Diabetes Paternal Grandmother     Social History Social History   Tobacco Use   Smoking status: Former    Types: Cigarettes    Quit date: 04/12/2018    Years since quitting: 3.8   Smokeless tobacco: Never  Vaping Use   Vaping Use: Never used  Substance Use Topics   Alcohol use: Yes    Comment: rare   Drug use: Never     Allergies   Patient has no known allergies.   Review of Systems Review of Systems  Constitutional: Negative.   HENT: Negative.    Eyes: Negative.   Respiratory: Negative.    Cardiovascular: Negative.   Skin: Negative.   Neurological:  Positive for dizziness and light-headedness. Negative for tremors, seizures, syncope, facial asymmetry, speech difficulty, weakness, numbness and headaches.     Physical Exam Triage Vital Signs ED Triage  Vitals  Enc Vitals Group     BP 03/04/22 1353 (!) 158/105     Pulse Rate 03/04/22 1353 (!) 124     Resp 03/04/22 1353 18     Temp 03/04/22 1353 98.7 F (37.1 C)     Temp Source 03/04/22 1353 Oral     SpO2 03/04/22 1353 95 %     Weight 03/04/22 1352 193 lb (87.5 kg)     Height 03/04/22 1352 5\' 2"  (1.575 m)     Head Circumference --      Peak Flow --      Pain Score 03/04/22 1352 0     Pain Loc --      Pain Edu? --      Excl. in [place]? --    No data found.  Updated Vital Signs BP (!) 158/105 (BP Location: Left Arm)   Pulse (!) 124   Temp 98.7 F (37.1 C) (Oral)   Resp 18   Ht 5\' 2"  (1.575 m)   Wt  193 lb (87.5 kg)   LMP 02/18/2022   SpO2 95%   BMI 35.30 kg/m   Visual Acuity Right Eye Distance:   Left Eye Distance:   Bilateral Distance:    Right Eye Near:   Left Eye Near:    Bilateral Near:     Physical Exam Constitutional:      Appearance: Normal appearance.  HENT:     Head: Normocephalic.     Right Ear: Tympanic membrane, ear canal and external ear normal.     Left Ear: Tympanic membrane, ear canal and external ear normal.  Eyes:     Extraocular Movements: Extraocular movements intact.  Cardiovascular:     Pulses: Normal pulses.     Heart sounds: Normal heart sounds.  Pulmonary:     Effort: Pulmonary effort is normal.     Breath sounds: Normal breath sounds.  Skin:    General: Skin is warm and dry.  Neurological:     General: No focal deficit present.     Mental Status: She is alert and oriented to person, place, and time. Mental status is at baseline.     Cranial Nerves: No cranial nerve deficit.     Sensory: No sensory deficit.     Motor: No weakness.     Coordination: Coordination normal.     Gait: Gait normal.  Psychiatric:        Mood and Affect: Mood normal.        Behavior: Behavior normal.      UC Treatments / Results  Labs (all labs ordered are listed, but only abnormal results are displayed) Labs Reviewed - No data to display  EKG   Radiology No results found.  Procedures Procedures (including critical care time)  Medications Ordered in UC Medications - No data to display  Initial Impression / Assessment and Plan / UC Course  I have reviewed the triage vital signs and the nursing notes.  Pertinent labs & imaging results that were available during my care of the patient were reviewed by me and considered in my medical decision making (see chart for details).  Vertigo  Description of symptoms is consistent with vertigo, no abnormalities found on the neurological nor ear exam, discussed with patient, will attempt use of  dimenhydrinate as well as Flonase to ensure that congestion is not contributing to symptoms, advised continued use of antihistamines, advised changing positions slowly waiting at least 10 seconds between movements to stabilize the body,  given written handout of exercises to help treat dizziness, advise follow-up with PCP in 1 week for reevaluation  Final Clinical Impressions(s) / UC Diagnoses   Final diagnoses:  None   Discharge Instructions   None    ED Prescriptions   None    PDMP not reviewed this encounter.   Valinda Hoar, NP 03/04/22 1437

## 2022-03-04 NOTE — ED Triage Notes (Addendum)
Pt c/o dizzy spells x2weeks.   Pt did a virtual visit and was diagnosed with benign vertigo. Pt was prescribed meclizine and states that it does not work.   Pt states the dizziness has been all day and is worse when bending over or rolling over in bed.   Pt states that ginger helps with symptoms and has been taking OTC "Ginger shots"  Pt states that she normally has High BP and that she only takes the amlodipine.

## 2022-03-04 NOTE — Discharge Instructions (Addendum)
Today you are being treated for vertigo (dizziness), on exam there are no abnormalities neurologically indicating a more serious cause and there is also no abnormalities in the ear exam  You may use Dramamine every 8 hours, be mindful this medication may make you drowsy,  Begin Flonase every morning to help clear out the sinuses to ensure that this is not a contributing factor  Continue to change positions slowly, waiting at least 10 seconds between movements to help the body stabilize   Ensure adequate intake of water as dehydration will make your symptoms worse  May Attempt to complete the following exercises below to see if they will help minimize your dizziness  -Shake: Turn your head from right to left and back again 10 times in 10 seconds. Twist your head round as far as it will go comfortably when you do this, and look in the direction your head is pointing. Wait 10 seconds after you have done 10 complete turns, then do 10 more.  -Nod: Nod your head up and down and back again 10 times in 10 seconds. Tip your head as far as it will go comfortably when you do this, and look in the direction your head is pointing. Wait 10 seconds after you have done 10 complete turns, then do 10 more.  -Shake, eyes closed (EC): Carry out the shake exercise with your eyes closed. Wait 10 seconds after you have done 10 complete turns, then do 10 more.  -Nod, eyes closed (EC): Carry out the nod exercise with your eyes closed. Wait 10 seconds after you have done 10 complete turns, then do 10 more.  -Shake/stare: Hold your finger pointing upwards in front of you and carry out the shake exercise while staring at your finger. Do not let your eyes move from your finger. Wait 10 seconds after you have done 10 complete turns, then do 10 more.  -Nod/stare: Hold your finger pointing sideways in front of you and carry out the nod exercise while staring at your finger. Do not let your eyes move from your finger. Wait 10  seconds after you have done 10 complete turns, then do 10 more.  Schedule follow-up appointment with your primary doctor in 1 week

## 2022-03-08 ENCOUNTER — Other Ambulatory Visit (HOSPITAL_COMMUNITY)
Admission: RE | Admit: 2022-03-08 | Discharge: 2022-03-08 | Disposition: A | Discharge status: Home / Self Care | Payer: BC Managed Care – PPO | Source: Ambulatory Visit | Attending: Obstetrics & Gynecology | Admitting: Obstetrics & Gynecology

## 2022-03-08 ENCOUNTER — Encounter: Payer: Self-pay | Admitting: Obstetrics & Gynecology

## 2022-03-08 ENCOUNTER — Ambulatory Visit (INDEPENDENT_AMBULATORY_CARE_PROVIDER_SITE_OTHER): Payer: BC Managed Care – PPO | Admitting: Obstetrics & Gynecology

## 2022-03-08 VITALS — BP 124/90 | HR 100 | Resp 16 | Ht 62.0 in | Wt 197.8 lb

## 2022-03-08 DIAGNOSIS — Z124 Encounter for screening for malignant neoplasm of cervix: Secondary | ICD-10-CM

## 2022-03-08 DIAGNOSIS — Z113 Encounter for screening for infections with a predominantly sexual mode of transmission: Secondary | ICD-10-CM

## 2022-03-08 DIAGNOSIS — Z30431 Encounter for routine checking of intrauterine contraceptive device: Secondary | ICD-10-CM | POA: Diagnosis not present

## 2022-03-08 DIAGNOSIS — Z3040 Encounter for surveillance of contraceptives, unspecified: Secondary | ICD-10-CM | POA: Diagnosis not present

## 2022-03-08 MED ORDER — ETONOGESTREL-ETHINYL ESTRADIOL 0.12-0.015 MG/24HR VA RING: VAGINAL_RING | VAGINAL | 4 refills | Status: DC

## 2022-03-08 NOTE — Progress Notes (Signed)
Subjective:     Jessica Humphrey is a 36 y.o. female here for a routine exam.  Current complaints: screening for cervical cancer, screening for STD, .encounter surveillance of contraception device  Personal health questionnaire reviewed: yes.   Gynecologic History Patient's last menstrual period was 02/18/2022 (exact date). Contraception: OCP (estrogen/progesterone) Last Pap: 03/08/2022. Results were: normal Last mammogram: undocumented  Obstetric History OB History  No obstetric history on file.     The following portions of the patient's history were reviewed and updated as appropriate: allergies, current medications, past family history, past medical history, past social history, past surgical history, and problem list.  Review of Systems A comprehensive review of systems was negative.    Objective:    BP (!) 124/90   Pulse 100   Resp 16   Ht 5\' 2"  (1.575 m) Comment: patient reports  Wt 197 lb 12.8 oz (89.7 kg)   LMP 02/18/2022 (Exact Date)   SpO2 99%   BMI 36.18 kg/m  General appearance: alert, cooperative, and no distress Abdomen: soft, non-tender; bowel sounds normal; no masses,  no organomegaly Pelvic: cervix normal in appearance, external genitalia normal, no adnexal masses or tenderness, no cervical motion tenderness, uterus normal size, shape, and consistency, and vagina normal without discharge Extremities: extremities normal, atraumatic, no cyanosis or edema Skin: Skin color, texture, turgor normal. No rashes or lesions    Assessment:    Healthy female exam.  Etonogestrel-Ethinyl Estradiol 0.12-0.015 MG/24HR Insert vaginally and leave in place for 3 consecutive weeks, then remove for 1 week.     Plan:    etonogestrel-ethinyl estradiol (NUVARING) 0.12-0.015 MG/24HR vaginal ring                          Insert vaginally and leave in place for 3 consecutive weeks, then remove for 1 week., Normal                               Cervicovaginal ancillary only                          Today, Resulting [facility] Collection Date-03/08/2022 Collection Time-10:02 AM                       Cytology - PAP                         Today, Resulting [facility] Collection Date-03/08/2022 Collection Time- 9:30 AM               HEP, RPR, HIV Panel                         Routine, Lab Collect    T YO as scheduled  Rosario Adie, MD  03/28/2022 5:07 AM

## 2022-03-09 ENCOUNTER — Telehealth: Payer: Self-pay

## 2022-03-09 LAB — CYTOLOGY - PAP
Adequacy: ABSENT
Chlamydia: NEGATIVE
Comment: NEGATIVE
Comment: NEGATIVE
Comment: NORMAL
Diagnosis: NEGATIVE
Neisseria Gonorrhea: NEGATIVE
Trichomonas: NEGATIVE

## 2022-03-09 LAB — CERVICOVAGINAL ANCILLARY ONLY
Bacterial Vaginitis (gardnerella): POSITIVE — AB
Candida Glabrata: NEGATIVE
Candida Vaginitis: NEGATIVE
Chlamydia: NEGATIVE
Comment: NEGATIVE
Comment: NEGATIVE
Comment: NEGATIVE
Comment: NEGATIVE
Comment: NEGATIVE
Comment: NORMAL
Neisseria Gonorrhea: NEGATIVE
Trichomonas: NEGATIVE

## 2022-03-09 LAB — HEP, RPR, HIV PANEL
HIV Screen 4th Generation wRfx: NONREACTIVE
Hepatitis B Surface Ag: NEGATIVE
RPR Ser Ql: NONREACTIVE

## 2022-03-09 NOTE — Telephone Encounter (Signed)
Contacted patient and advised her order was placed in error per Dr. Langley Gauss will cancel order. KW

## 2022-03-09 NOTE — Telephone Encounter (Signed)
Pt called triage returning missed call from 03/08/22. Says she had a voice msg saying they were calling her to schedule U/S appt. She says she is confused/scared of why she needs an U/S. There is a referral for an U/S with diagnosis of IUD check up, pt says she does not have IUD, she is on nuvaring. Nunzio Cory, can you pls look into this with Dr. Langley Gauss about pt's visit from yesterday and call her back to clarify?

## 2022-03-22 ENCOUNTER — Other Ambulatory Visit: Payer: Self-pay

## 2022-03-22 DIAGNOSIS — B9689 Other specified bacterial agents as the cause of diseases classified elsewhere: Secondary | ICD-10-CM

## 2022-03-22 MED ORDER — METRONIDAZOLE 500 MG PO TABS
500.0000 mg | ORAL_TABLET | Freq: Two times a day (BID) | ORAL | 0 refills | Status: DC
Start: 2022-03-22 — End: 2022-07-21

## 2022-03-28 ENCOUNTER — Encounter: Payer: Self-pay | Admitting: Obstetrics & Gynecology

## 2022-07-21 ENCOUNTER — Ambulatory Visit (INDEPENDENT_AMBULATORY_CARE_PROVIDER_SITE_OTHER): Payer: BC Managed Care – PPO | Admitting: Family Medicine

## 2022-07-21 ENCOUNTER — Encounter: Payer: Self-pay | Admitting: Family Medicine

## 2022-07-21 VITALS — BP 144/98 | HR 120 | Ht 62.0 in | Wt 189.0 lb

## 2022-07-21 DIAGNOSIS — R7303 Prediabetes: Secondary | ICD-10-CM | POA: Diagnosis not present

## 2022-07-21 DIAGNOSIS — I10 Essential (primary) hypertension: Secondary | ICD-10-CM

## 2022-07-21 DIAGNOSIS — D649 Anemia, unspecified: Secondary | ICD-10-CM | POA: Diagnosis not present

## 2022-07-21 MED ORDER — LISINOPRIL-HYDROCHLOROTHIAZIDE 20-25 MG PO TABS
1.0000 | ORAL_TABLET | Freq: Every day | ORAL | 1 refills | Status: DC
Start: 2022-07-21 — End: 2022-08-22

## 2022-07-21 MED ORDER — DILTIAZEM HCL ER COATED BEADS 120 MG PO CP24: 120.0000 mg | ORAL_CAPSULE | Freq: Every day | ORAL | 0 refills | Status: DC

## 2022-07-21 NOTE — Progress Notes (Signed)
Date:  07/21/2022   Name:  Jessica Humphrey   DOB:  1986/02/18   MRN:  [id number]   Chief Complaint: Prediabetes, Anemia, and Hypertension  Anemia Presents for follow-up visit. Symptoms include malaise/fatigue and weight loss. There has been no abdominal pain, bruising/bleeding easily, fever or palpitations. Signs of blood loss that are not present include hematemesis, hematochezia, melena, menorrhagia and vaginal bleeding. There is no history of chronic renal disease or heart failure. There are no compliance problems.   Hypertension This is a chronic problem. The current episode started more than 1 year ago. The problem has been waxing and waning since onset. The problem is uncontrolled. Associated symptoms include malaise/fatigue. Pertinent negatives include no blurred vision, chest pain, headaches, orthopnea, palpitations, PND or shortness of breath. There are no associated agents to hypertension. Past treatments include ACE inhibitors, calcium channel blockers and diuretics. The current treatment provides moderate improvement. There are no compliance problems.  There is no history of CAD/MI, CVA or heart failure. There is no history of chronic renal disease, a hypertension causing med or renovascular disease.    Lab Results  Component Value Date   NA 137 01/17/2022   K 4.4 01/17/2022   CO2 22 01/17/2022   GLUCOSE 97 01/17/2022   BUN 17 01/17/2022   CREATININE 0.79 01/17/2022   CALCIUM 10.2 01/17/2022   EGFR 98 01/17/2022   GFRNONAA 94 05/08/2020   Lab Results  Component Value Date   CHOL 196 07/20/2021   HDL 64 07/20/2021   LDLCALC 105 (H) 07/20/2021   TRIG 154 (H) 07/20/2021   Lab Results  Component Value Date   TSH 1.780 01/17/2022   Lab Results  Component Value Date   HGBA1C 5.8 (H) 01/17/2022   Lab Results  Component Value Date   WBC 8.3 01/17/2022   HGB 9.9 (L) 01/17/2022   HCT 32.8 (L) 01/17/2022   MCV 74 (L) 01/17/2022   PLT 519 (H) 01/17/2022   No  results found for: "ALT", "AST", "GGT", "ALKPHOS", "BILITOT" No results found for: "25OHVITD2", "25OHVITD3", "VD25OH"   Review of Systems  Constitutional:  Positive for fatigue, malaise/fatigue and weight loss. Negative for fever.  HENT:  Negative for congestion.   Eyes:  Negative for blurred vision.  Respiratory:  Negative for chest tightness, shortness of breath and wheezing.   Cardiovascular:  Negative for chest pain, palpitations, orthopnea and PND.  Gastrointestinal:  Negative for abdominal pain, blood in stool, constipation, hematemesis, hematochezia and melena.  Genitourinary:  Negative for menorrhagia and vaginal bleeding.  Neurological:  Negative for headaches.  Hematological:  Does not bruise/bleed easily.    Patient Active Problem List   Diagnosis Date Noted   HTN (hypertension), benign 11/03/2015   Asthma exacerbation 04/01/2015    No Known Allergies  Past Surgical History:  Procedure Laterality Date   CESAREAN SECTION     x 1    Social History   Tobacco Use   Smoking status: Former    Types: Cigarettes    Quit date: 04/12/2018    Years since quitting: 4.2   Smokeless tobacco: Never  Vaping Use   Vaping Use: Never used  Substance Use Topics   Alcohol use: Yes    Comment: rare   Drug use: Never     Medication list has been reviewed and updated.  Current Meds  Medication Sig   albuterol (VENTOLIN HFA) 108 (90 Base) MCG/ACT inhaler Inhale 2 puffs into the lungs every 6 (six) hours as needed  for wheezing or shortness of breath.   amLODipine (NORVASC) 2.5 MG tablet Take 1 tablet (2.5 mg total) by mouth daily.   dimenhyDRINATE (DRAMAMINE) 50 MG tablet Take 1 tablet (50 mg total) by mouth every 8 (eight) hours as needed.   etonogestrel-ethinyl estradiol (NUVARING) 0.12-0.015 MG/24HR vaginal ring Insert vaginally and leave in place for 3 consecutive weeks, then remove for 1 week.   fluticasone (FLONASE) 50 MCG/ACT nasal spray Place 1 spray into both nostrils  daily.   lisinopril-hydrochlorothiazide (ZESTORETIC) 20-25 MG tablet Take 1 tablet by mouth daily.   TRELEGY ELLIPTA 200-62.5-25 MCG/INH AEPB pulm       07/21/2022    1:43 PM 01/17/2022    3:06 PM 05/08/2020   10:22 AM 11/14/2019    8:09 AM  GAD 7 : Generalized Anxiety Score  Nervous, Anxious, on Edge 0 3 0 0  Control/stop worrying 0 2 0 0  Worry too much - different things 0 2 0 0  Trouble relaxing 0 2 2 1   Restless 0 2 0 0  Easily annoyed or irritable 0 2 0 0  Afraid - awful might happen 0 1 0 0  Total GAD 7 Score 0 14 2 1   Anxiety Difficulty Not difficult at all Somewhat difficult Not difficult at all Not difficult at all       07/21/2022    1:43 PM 01/17/2022    3:06 PM 06/07/2021    1:41 PM  Depression screen PHQ 2/9  Decreased Interest 0 0 0  Down, Depressed, Hopeless 0 0 0  PHQ - 2 Score 0 0 0  Altered sleeping 0 2   Tired, decreased energy 0 2   Change in appetite 0 0   Feeling bad or failure about yourself  0 0   Trouble concentrating 0 1   Moving slowly or fidgety/restless 0 0   Suicidal thoughts 0 0   PHQ-9 Score 0 5   Difficult doing work/chores Not difficult at all Not difficult at all     BP Readings from Last 3 Encounters:  07/21/22 (!) 138/102  03/08/22 (!) 124/90  03/04/22 (!) 158/105    Physical Exam Vitals and nursing note reviewed. Exam conducted with a chaperone present.  Constitutional:      General: She is not in acute distress.    Appearance: She is not diaphoretic.  HENT:     Head: Normocephalic and atraumatic.     Right Ear: Tympanic membrane and external ear normal.     Left Ear: Tympanic membrane and external ear normal.     Nose: Nose normal. No congestion or rhinorrhea.     Mouth/Throat:     Mouth: Mucous membranes are moist.  Eyes:     General:        Right eye: No discharge.        Left eye: No discharge.     Conjunctiva/sclera: Conjunctivae normal.     Pupils: Pupils are equal, round, and reactive to light.  Neck:      Thyroid: No thyromegaly.     Vascular: No JVD.  Cardiovascular:     Rate and Rhythm: Normal rate and regular rhythm.     Heart sounds: Normal heart sounds. No murmur heard.    No friction rub. No gallop.  Pulmonary:     Effort: Pulmonary effort is normal.     Breath sounds: Normal breath sounds. No wheezing, rhonchi or rales.  Abdominal:     General: Bowel sounds are normal.  Palpations: Abdomen is soft. There is no mass.     Tenderness: There is no abdominal tenderness. There is no guarding.  Musculoskeletal:        General: Normal range of motion.     Cervical back: Normal range of motion and neck supple.  Lymphadenopathy:     Cervical: No cervical adenopathy.  Skin:    General: Skin is warm and dry.  Neurological:     Mental Status: She is alert.     Deep Tendon Reflexes: Reflexes are normal and symmetric.     Wt Readings from Last 3 Encounters:  07/21/22 189 lb (85.7 kg)  03/08/22 197 lb 12.8 oz (89.7 kg)  03/04/22 193 lb (87.5 kg)    BP (!) 138/102 (BP Location: Left Arm, Cuff Size: Large)   Pulse 100   Ht 5\' 2"  (1.575 m)   Wt 189 lb (85.7 kg)   LMP 07/18/2022 (Approximate)   SpO2 98%   BMI 34.57 kg/m   Assessment and Plan:  1. Essential hypertension Chronic.  Uncontrolled.  Stable.  Patient has not been taking her antihypertensive medications on a daily basis and has not missed a few days.  However she did take it today.  Blood pressure was noted to be 144/98 and pulse rate was 120 which come down to 104 but would go back to 120.  We will discontinue the amlodipine and replace it with diltiazem CD1 20 mg once a day and continue lisinopril hydrochlorothiazide 20-25 we will check a CMP for electrolytes and GFR.  Patient will return in 4 weeks for blood pressure reading and will adjust accordingly. - lisinopril-hydrochlorothiazide (ZESTORETIC) 20-25 MG tablet; Take 1 tablet by mouth daily.  Dispense: 90 tablet; Refill: 1 - Comprehensive Metabolic Panel (CMET) -  diltiazem (CARDIZEM CD) 120 MG 24 hr capsule; Take 1 capsule (120 mg total) by mouth daily.  Dispense: 60 capsule; Refill: 0  2. Anemia, unspecified type Chronic.  Persistent.  Patient is unable to take 325 mg iron and we will recheck because patient has been taking a multivitamin with a smaller amount of iron and we will see if this is been sufficient.  We will check a CBC and ferritin for current level of control and adjust accordingly.  Patient is unable to take 325 mg ferrous sulfate due to nausea even in the gummy form. - CBC w/Diff/Platelet - Ferritin  3. Prediabetes Chronic.  Controlled.  Stable.  Patient has had prediabetic readings and we will recheck A1c as well as lipid panel and have patient return in 4 weeks. - HgB A1c - Lipid Panel With LDL/HDL Ratio    Otilio Miu, MD

## 2022-07-21 NOTE — Patient Instructions (Signed)

## 2022-07-22 ENCOUNTER — Other Ambulatory Visit: Payer: Self-pay

## 2022-07-22 DIAGNOSIS — D649 Anemia, unspecified: Secondary | ICD-10-CM

## 2022-07-22 LAB — CBC WITH DIFFERENTIAL/PLATELET
Basophils Absolute: 0.1 10*3/uL (ref 0.0–0.2)
Basos: 1 %
EOS (ABSOLUTE): 0.6 10*3/uL — ABNORMAL HIGH (ref 0.0–0.4)
Eos: 8 %
Hematocrit: 30.3 % — ABNORMAL LOW (ref 34.0–46.6)
Hemoglobin: 9.4 g/dL — ABNORMAL LOW (ref 11.1–15.9)
Immature Grans (Abs): 0 10*3/uL (ref 0.0–0.1)
Immature Granulocytes: 0 %
Lymphocytes Absolute: 2.2 10*3/uL (ref 0.7–3.1)
Lymphs: 29 %
MCH: 23.2 pg — ABNORMAL LOW (ref 26.6–33.0)
MCHC: 31 g/dL — ABNORMAL LOW (ref 31.5–35.7)
MCV: 75 fL — ABNORMAL LOW (ref 79–97)
Monocytes Absolute: 0.5 10*3/uL (ref 0.1–0.9)
Monocytes: 6 %
Neutrophils Absolute: 4.2 10*3/uL (ref 1.4–7.0)
Neutrophils: 56 %
Platelets: 542 10*3/uL — ABNORMAL HIGH (ref 150–450)
RBC: 4.05 x10E6/uL (ref 3.77–5.28)
RDW: 17.1 % — ABNORMAL HIGH (ref 11.7–15.4)
WBC: 7.6 10*3/uL (ref 3.4–10.8)

## 2022-07-22 LAB — HEMOGLOBIN A1C
Est. average glucose Bld gHb Est-mCnc: 111 mg/dL
Hgb A1c MFr Bld: 5.5 % (ref 4.8–5.6)

## 2022-07-22 LAB — LIPID PANEL WITH LDL/HDL RATIO
Cholesterol, Total: 183 mg/dL (ref 100–199)
HDL: 64 mg/dL (ref >39)
LDL Chol Calc (NIH): 82 mg/dL (ref 0–99)
LDL/HDL Ratio: 1.3 ratio (ref 0.0–3.2)
Triglycerides: 226 mg/dL — ABNORMAL HIGH (ref 0–149)
VLDL Cholesterol Cal: 37 mg/dL (ref 5–40)

## 2022-07-22 LAB — COMPREHENSIVE METABOLIC PANEL
ALT: 13 IU/L (ref 0–32)
AST: 22 IU/L (ref 0–40)
Albumin/Globulin Ratio: 1.7 (ref 1.2–2.2)
Albumin: 4.4 g/dL (ref 3.9–4.9)
Alkaline Phosphatase: 56 IU/L (ref 44–121)
BUN/Creatinine Ratio: 10 (ref 9–23)
BUN: 8 mg/dL (ref 6–20)
Bilirubin Total: 0.4 mg/dL (ref 0.0–1.2)
CO2: 22 mmol/L (ref 20–29)
Calcium: 9.6 mg/dL (ref 8.7–10.2)
Chloride: 100 mmol/L (ref 96–106)
Creatinine, Ser: 0.78 mg/dL (ref 0.57–1.00)
Globulin, Total: 2.6 g/dL (ref 1.5–4.5)
Glucose: 106 mg/dL — ABNORMAL HIGH (ref 70–99)
Potassium: 3.5 mmol/L (ref 3.5–5.2)
Sodium: 140 mmol/L (ref 134–144)
Total Protein: 7 g/dL (ref 6.0–8.5)
eGFR: 100 mL/min/{1.73_m2} (ref >59)

## 2022-07-22 LAB — FERRITIN: Ferritin: 9 ng/mL — ABNORMAL LOW (ref 15–150)

## 2022-08-04 ENCOUNTER — Telehealth: Payer: Self-pay

## 2022-08-04 DIAGNOSIS — D509 Iron deficiency anemia, unspecified: Secondary | ICD-10-CM | POA: Insufficient documentation

## 2022-08-04 NOTE — Telephone Encounter (Signed)
Courtesy call made to pt. NO answer. Detailed VM left on cell phone with appt details, cancer center location, visitor and mask policy

## 2022-08-05 ENCOUNTER — Inpatient Hospital Stay: Payer: BC Managed Care – PPO

## 2022-08-05 ENCOUNTER — Inpatient Hospital Stay: Payer: BC Managed Care – PPO | Attending: Oncology | Admitting: Oncology

## 2022-08-05 ENCOUNTER — Encounter: Payer: Self-pay | Admitting: Oncology

## 2022-08-05 VITALS — BP 183/110 | HR 122 | Temp 98.4°F | Resp 18 | Wt 190.1 lb

## 2022-08-05 DIAGNOSIS — Z8249 Family history of ischemic heart disease and other diseases of the circulatory system: Secondary | ICD-10-CM | POA: Diagnosis not present

## 2022-08-05 DIAGNOSIS — F5089 Other specified eating disorder: Secondary | ICD-10-CM | POA: Insufficient documentation

## 2022-08-05 DIAGNOSIS — Z833 Family history of diabetes mellitus: Secondary | ICD-10-CM | POA: Diagnosis not present

## 2022-08-05 DIAGNOSIS — R5383 Other fatigue: Secondary | ICD-10-CM | POA: Insufficient documentation

## 2022-08-05 DIAGNOSIS — Z87891 Personal history of nicotine dependence: Secondary | ICD-10-CM | POA: Diagnosis not present

## 2022-08-05 DIAGNOSIS — D509 Iron deficiency anemia, unspecified: Secondary | ICD-10-CM | POA: Insufficient documentation

## 2022-08-05 DIAGNOSIS — D508 Other iron deficiency anemias: Secondary | ICD-10-CM | POA: Diagnosis not present

## 2022-08-05 DIAGNOSIS — Z8049 Family history of malignant neoplasm of other genital organs: Secondary | ICD-10-CM | POA: Insufficient documentation

## 2022-08-05 DIAGNOSIS — Z79899 Other long term (current) drug therapy: Secondary | ICD-10-CM | POA: Diagnosis not present

## 2022-08-05 NOTE — Progress Notes (Signed)
Hematology/Oncology Consult note [phone] [phone]      Patient Care Team: Juline Patch, MD as PCP - General (Family Medicine)   REFERRING PROVIDER: Juline Patch, MD  CHIEF COMPLAINTS/REASON FOR VISIT:  Anemia  ASSESSMENT & PLAN:  IDA (iron deficiency anemia) Previous labs were reviewed and discussed with patient.  Consistent with iron deficiency anemia, despite oral iron supplementation. Alternative option of proceed with IV Venofer treatments. I discussed about the potential risks including but not limited to allergic reactions/infusion reactions including anaphylactic reactions, phlebitis, high blood pressure, wheezing, SOB, skin rash, weight gain,dark urine, leg swelling, back pain, headache, nausea and fatigue, etc. Patient tolerates oral iron supplement poorly agrees with IV Venofer treatments.  Plan IV venofer weekly x 4  Etiology of iron deficiency is unclear.  Malabsorption versus chronic bleeding.  Check celiac panel at the next visit.  Orders Placed This Encounter  Procedures   CBC with Differential ([place] Only)    Standing Status:   Future    Standing Expiration Date:   08/06/2023   Ferritin    Standing Status:   Future    Standing Expiration Date:   08/06/2023   Iron and TIBC    Standing Status:   Future    Standing Expiration Date:   08/06/2023   Celiac panel 10    Standing Status:   Future    Standing Expiration Date:   08/06/2023   Retic Panel    Standing Status:   Future    Standing Expiration Date:   08/06/2023   Follow-up in 3 months All questions were answered. The patient knows to call the clinic with any problems, questions or concerns.  Earlie Server, MD, PhD [facility] Hematology Oncology 08/05/2022     HISTORY OF PRESENTING ILLNESS:  Jessica Humphrey is a  37 y.o.  female with PMH listed below who was referred to me for anemia Reviewed patient's recent labs that was done.  She was found to have abnormal  CBC since July 2023.  She had normal hemoglobin of 12 in 2017.  Mildly anemic with a hemoglobin of 11.8 in 2016. More recently 07/21/2022, hemoglobin 9.4, MCV 75, platelet count 542. 07/21/2022, ferritin 9. Patient reports feeling fatigued.  She denies recent chest pain on exertion, pre-syncopal episodes, or palpitations She had not noticed any recent bleeding such as epistaxis, hematuria or hematochezia.  Her menstrual period is very light.  She has NuvaRing She denies over the counter NSAID ingestion.  + Pica    MEDICAL HISTORY:  Past Medical History:  Diagnosis Date   Asthma    Hypertension     SURGICAL HISTORY: Past Surgical History:  Procedure Laterality Date   CESAREAN SECTION     x 1    SOCIAL HISTORY: Social History   Socioeconomic History   Marital status: Single    Spouse name: Not on file   Number of children: Not on file   Years of education: Not on file   Highest education level: Not on file  Occupational History   Not on file  Tobacco Use   Smoking status: Former    Types: Cigarettes    Quit date: 04/12/2018    Years since quitting: 4.3   Smokeless tobacco: Never   Tobacco comments:    Very light smoker, about 1 cig a day   Vaping Use   Vaping Use: Never used  Substance and Sexual Activity   Alcohol use: Not Currently    Comment:  rare   Drug use: Never   Sexual activity: Yes  Other Topics Concern   Not on file  Social History Narrative   Not on file   Social Determinants of Health   Financial Resource Strain: Not on file  Food Insecurity: No Food Insecurity (08/05/2022)   Hunger Vital Sign    Worried About Running Out of Food in the Last Year: Never true    Ran Out of Food in the Last Year: Never true  Transportation Needs: Not on file  Physical Activity: Not on file  Stress: Not on file  Social Connections: Not on file  Intimate Partner Violence: Not At Risk (08/05/2022)   Humiliation, Afraid, Rape, and Kick questionnaire    Fear of  Current or Ex-Partner: No    Emotionally Abused: No    Physically Abused: No    Sexually Abused: No    FAMILY HISTORY: Family History  Problem Relation Age of Onset   Hypertension Mother    Cervical cancer Mother    Hypertension Father    Diabetes Maternal Grandmother    Diabetes Paternal Grandmother     ALLERGIES:  has No Known Allergies.  MEDICATIONS:  Current Outpatient Medications  Medication Sig Dispense Refill   albuterol (VENTOLIN HFA) 108 (90 Base) MCG/ACT inhaler Inhale 2 puffs into the lungs every 6 (six) hours as needed for wheezing or shortness of breath. 18 g 11   diltiazem (CARDIZEM CD) 120 MG 24 hr capsule Take 1 capsule (120 mg total) by mouth daily. 60 capsule 0   dimenhyDRINATE (DRAMAMINE) 50 MG tablet Take 1 tablet (50 mg total) by mouth every 8 (eight) hours as needed. 30 tablet 0   etonogestrel-ethinyl estradiol (NUVARING) 0.12-0.015 MG/24HR vaginal ring Insert vaginally and leave in place for 3 consecutive weeks, then remove for 1 week. 3 each 4   fluticasone (FLONASE) 50 MCG/ACT nasal spray Place 1 spray into both nostrils daily. 9.9 mL 0   lisinopril-hydrochlorothiazide (ZESTORETIC) 20-25 MG tablet Take 1 tablet by mouth daily. 90 tablet 1   TRELEGY ELLIPTA 200-62.5-25 MCG/INH AEPB pulm     Iron, Ferrous Sulfate, 325 (65 Fe) MG TABS Take 325 mg by mouth daily. (Patient not taking: Reported on 07/21/2022) 30 tablet 5   meclizine (ANTIVERT) 25 MG tablet Take 25 mg by mouth every 6 (six) hours as needed. (Patient not taking: Reported on 07/21/2022)     No current facility-administered medications for this visit.    Review of Systems  Constitutional:  Positive for fatigue. Negative for appetite change, chills and fever.  HENT:   Negative for hearing loss and voice change.   Eyes:  Negative for eye problems.  Respiratory:  Negative for chest tightness and cough.   Cardiovascular:  Negative for chest pain.  Gastrointestinal:  Negative for abdominal distention,  abdominal pain and blood in stool.  Endocrine: Negative for hot flashes.  Genitourinary:  Negative for difficulty urinating and frequency.   Musculoskeletal:  Negative for arthralgias.  Skin:  Negative for itching and rash.  Neurological:  Negative for extremity weakness.  Hematological:  Negative for adenopathy.  Psychiatric/Behavioral:  Negative for confusion.     PHYSICAL EXAMINATION: ECOG PERFORMANCE STATUS: 1 - Symptomatic but completely ambulatory Vitals:   08/05/22 1134  BP: (!) 183/110  Pulse: (!) 122  Resp: 18  Temp: 98.4 F (36.9 C)  SpO2: 98%   Filed Weights   08/05/22 1134  Weight: 190 lb 1.6 oz (86.2 kg)    Physical Exam Constitutional:  General: She is not in acute distress. HENT:     Head: Normocephalic and atraumatic.  Eyes:     General: No scleral icterus. Cardiovascular:     Rate and Rhythm: Normal rate and regular rhythm.     Heart sounds: Normal heart sounds.  Pulmonary:     Effort: Pulmonary effort is normal. No respiratory distress.     Breath sounds: No wheezing.  Abdominal:     General: Bowel sounds are normal. There is no distension.     Palpations: Abdomen is soft.  Musculoskeletal:        General: No deformity. Normal range of motion.     Cervical back: Normal range of motion and neck supple.  Skin:    General: Skin is warm and dry.     Findings: No erythema or rash.  Neurological:     Mental Status: She is alert and oriented to person, place, and time. Mental status is at baseline.     Cranial Nerves: No cranial nerve deficit.     Coordination: Coordination normal.  Psychiatric:        Mood and Affect: Mood normal.      LABORATORY DATA:  I have reviewed the data as listed    Latest Ref Rng & Units 07/21/2022    2:34 PM 01/17/2022    3:39 PM 11/04/2015    5:08 AM  CBC  WBC 3.4 - 10.8 x10E3/uL 7.6  8.3  11.9   Hemoglobin 11.1 - 15.9 g/dL 9.4  9.9  12.0   Hematocrit 34.0 - 46.6 % 30.3  32.8  37.2   Platelets 150 - 450  x10E3/uL 542  519  410       Latest Ref Rng & Units 07/21/2022    2:34 PM 01/17/2022    3:39 PM 07/20/2021    9:36 AM  CMP  Glucose 70 - 99 mg/dL 106  97  101   BUN 6 - 20 mg/dL 8  17  9   $ Creatinine 0.57 - 1.00 mg/dL 0.78  0.79  0.75   Sodium 134 - 144 mmol/L 140  137  140   Potassium 3.5 - 5.2 mmol/L 3.5  4.4  4.2   Chloride 96 - 106 mmol/L 100  100  102   CO2 20 - 29 mmol/L 22  22  23   $ Calcium 8.7 - 10.2 mg/dL 9.6  10.2  9.6   Total Protein 6.0 - 8.5 g/dL 7.0     Total Bilirubin 0.0 - 1.2 mg/dL 0.4     Alkaline Phos 44 - 121 IU/L 56     AST 0 - 40 IU/L 22     ALT 0 - 32 IU/L 13         Component Value Date/Time   FERRITIN 9 (L) 07/21/2022 1434     RADIOGRAPHIC STUDIES: I have personally reviewed the radiological images as listed and agreed with the findings in the report. No results found.

## 2022-08-05 NOTE — Assessment & Plan Note (Addendum)
Previous labs were reviewed and discussed with patient.  Consistent with iron deficiency anemia, despite oral iron supplementation. Alternative option of proceed with IV Venofer treatments. I discussed about the potential risks including but not limited to allergic reactions/infusion reactions including anaphylactic reactions, phlebitis, high blood pressure, wheezing, SOB, skin rash, weight gain,dark urine, leg swelling, back pain, headache, nausea and fatigue, etc. Patient tolerates oral iron supplement poorly agrees with IV Venofer treatments.  Plan IV venofer weekly x 4  Etiology of iron deficiency is unclear.  Malabsorption versus chronic bleeding.  Check celiac panel at the next visit.

## 2022-08-12 ENCOUNTER — Inpatient Hospital Stay: Payer: BC Managed Care – PPO

## 2022-08-12 VITALS — BP 155/100 | HR 109 | Temp 98.0°F | Resp 17

## 2022-08-12 DIAGNOSIS — D509 Iron deficiency anemia, unspecified: Secondary | ICD-10-CM | POA: Diagnosis not present

## 2022-08-12 DIAGNOSIS — Z79899 Other long term (current) drug therapy: Secondary | ICD-10-CM | POA: Diagnosis not present

## 2022-08-12 DIAGNOSIS — Z8049 Family history of malignant neoplasm of other genital organs: Secondary | ICD-10-CM | POA: Diagnosis not present

## 2022-08-12 DIAGNOSIS — D508 Other iron deficiency anemias: Secondary | ICD-10-CM

## 2022-08-12 DIAGNOSIS — Z833 Family history of diabetes mellitus: Secondary | ICD-10-CM | POA: Diagnosis not present

## 2022-08-12 DIAGNOSIS — F5089 Other specified eating disorder: Secondary | ICD-10-CM | POA: Diagnosis not present

## 2022-08-12 DIAGNOSIS — Z87891 Personal history of nicotine dependence: Secondary | ICD-10-CM | POA: Diagnosis not present

## 2022-08-12 DIAGNOSIS — Z8249 Family history of ischemic heart disease and other diseases of the circulatory system: Secondary | ICD-10-CM | POA: Diagnosis not present

## 2022-08-12 DIAGNOSIS — R5383 Other fatigue: Secondary | ICD-10-CM | POA: Diagnosis not present

## 2022-08-12 MED ORDER — SODIUM CHLORIDE 0.9% FLUSH
10.0000 mL | Freq: Once | INTRAVENOUS | Status: AC | PRN
  Administered 2022-08-12: 10 mL
  Filled 2022-08-12: qty 10

## 2022-08-12 MED ORDER — SODIUM CHLORIDE 0.9 % IV SOLN
200.0000 mg | Freq: Once | INTRAVENOUS | Status: AC
  Administered 2022-08-12: 200 mg via INTRAVENOUS
  Filled 2022-08-12: qty 200

## 2022-08-12 MED ORDER — SODIUM CHLORIDE 0.9 % IV SOLN
Freq: Once | INTRAVENOUS | Status: AC
  Filled 2022-08-12: qty 250

## 2022-08-12 NOTE — Progress Notes (Signed)
BP 156/102 P 122 MD notified per Dr.Yu ok to proceed with Venofer.

## 2022-08-12 NOTE — Patient Instructions (Signed)

## 2022-08-12 NOTE — Progress Notes (Signed)
Patient tolerated first Venofer infusion well, no questions/concerns voiced. Monitored 30 min post transfusion. BP still elevated, asymptomatic. Patient states PCP monitors BP. Patient stable at discharge. VSS. AVS given.

## 2022-08-18 ENCOUNTER — Ambulatory Visit: Payer: BC Managed Care – PPO | Admitting: Family Medicine

## 2022-08-18 DIAGNOSIS — R0609 Other forms of dyspnea: Secondary | ICD-10-CM | POA: Diagnosis not present

## 2022-08-18 DIAGNOSIS — J4540 Moderate persistent asthma, uncomplicated: Secondary | ICD-10-CM | POA: Diagnosis not present

## 2022-08-19 ENCOUNTER — Inpatient Hospital Stay: Payer: BC Managed Care – PPO | Attending: Oncology

## 2022-08-19 VITALS — BP 136/92 | HR 113 | Temp 99.8°F | Resp 18

## 2022-08-19 DIAGNOSIS — D509 Iron deficiency anemia, unspecified: Secondary | ICD-10-CM | POA: Diagnosis not present

## 2022-08-19 DIAGNOSIS — Z87891 Personal history of nicotine dependence: Secondary | ICD-10-CM | POA: Insufficient documentation

## 2022-08-19 DIAGNOSIS — D508 Other iron deficiency anemias: Secondary | ICD-10-CM

## 2022-08-19 DIAGNOSIS — R5383 Other fatigue: Secondary | ICD-10-CM | POA: Insufficient documentation

## 2022-08-19 DIAGNOSIS — Z79899 Other long term (current) drug therapy: Secondary | ICD-10-CM | POA: Insufficient documentation

## 2022-08-19 MED ORDER — SODIUM CHLORIDE 0.9 % IV SOLN
Freq: Once | INTRAVENOUS | Status: AC
  Filled 2022-08-19: qty 250

## 2022-08-19 MED ORDER — SODIUM CHLORIDE 0.9 % IV SOLN
200.0000 mg | Freq: Once | INTRAVENOUS | Status: AC
  Administered 2022-08-19: 200 mg via INTRAVENOUS
  Filled 2022-08-19: qty 200

## 2022-08-22 ENCOUNTER — Ambulatory Visit (INDEPENDENT_AMBULATORY_CARE_PROVIDER_SITE_OTHER): Payer: BC Managed Care – PPO | Admitting: Family Medicine

## 2022-08-22 ENCOUNTER — Encounter: Payer: Self-pay | Admitting: Family Medicine

## 2022-08-22 VITALS — BP 126/74 | HR 80 | Ht 62.0 in | Wt 184.0 lb

## 2022-08-22 DIAGNOSIS — I10 Essential (primary) hypertension: Secondary | ICD-10-CM

## 2022-08-22 MED ORDER — DILTIAZEM HCL ER COATED BEADS 120 MG PO CP24: 120.0000 mg | ORAL_CAPSULE | Freq: Every day | ORAL | 1 refills | Status: DC

## 2022-08-22 MED ORDER — LISINOPRIL-HYDROCHLOROTHIAZIDE 20-25 MG PO TABS: 1.0000 | ORAL_TABLET | Freq: Every day | ORAL | 1 refills | Status: DC

## 2022-08-22 NOTE — Progress Notes (Signed)
Date:  08/22/2022   Name:  Jessica Humphrey   DOB:  08-26-1985   MRN:  [id number]   Chief Complaint: Hypertension (Bp recheck after adding Diltiazem- worse was [id number] systolic)  Hypertension This is a chronic problem. The current episode started more than 1 year ago. The problem has been gradually improving since onset. The problem is controlled. Pertinent negatives include no chest pain, orthopnea, palpitations, peripheral edema, PND or shortness of breath. There are no associated agents to hypertension.    Lab Results  Component Value Date   NA 140 07/21/2022   K 3.5 07/21/2022   CO2 22 07/21/2022   GLUCOSE 106 (H) 07/21/2022   BUN 8 07/21/2022   CREATININE 0.78 07/21/2022   CALCIUM 9.6 07/21/2022   EGFR 100 07/21/2022   GFRNONAA 94 05/08/2020   Lab Results  Component Value Date   CHOL 183 07/21/2022   HDL 64 07/21/2022   LDLCALC 82 07/21/2022   TRIG 226 (H) 07/21/2022   Lab Results  Component Value Date   TSH 1.780 01/17/2022   Lab Results  Component Value Date   HGBA1C 5.5 07/21/2022   Lab Results  Component Value Date   WBC 7.6 07/21/2022   HGB 9.4 (L) 07/21/2022   HCT 30.3 (L) 07/21/2022   MCV 75 (L) 07/21/2022   PLT 542 (H) 07/21/2022   Lab Results  Component Value Date   ALT 13 07/21/2022   AST 22 07/21/2022   ALKPHOS 56 07/21/2022   BILITOT 0.4 07/21/2022   No results found for: "25OHVITD2", "25OHVITD3", "VD25OH"   Review of Systems  Respiratory:  Negative for cough, chest tightness, shortness of breath and wheezing.   Cardiovascular:  Negative for chest pain, palpitations, orthopnea and PND.    Patient Active Problem List   Diagnosis Date Noted   IDA (iron deficiency anemia) 08/04/2022   HTN (hypertension), benign 11/03/2015   Asthma exacerbation 04/01/2015    No Known Allergies  Past Surgical History:  Procedure Laterality Date   CESAREAN SECTION     x 1    Social History   Tobacco Use   Smoking status: Former    Types:  Cigarettes    Quit date: 04/12/2018    Years since quitting: 4.3   Smokeless tobacco: Never   Tobacco comments:    Very light smoker, about 1 cig a day   Vaping Use   Vaping Use: Never used  Substance Use Topics   Alcohol use: Not Currently    Comment: rare   Drug use: Never     Medication list has been reviewed and updated.  Current Meds  Medication Sig   albuterol (VENTOLIN HFA) 108 (90 Base) MCG/ACT inhaler Inhale 2 puffs into the lungs every 6 (six) hours as needed for wheezing or shortness of breath.   diltiazem (CARDIZEM CD) 120 MG 24 hr capsule Take 1 capsule (120 mg total) by mouth daily.   dimenhyDRINATE (DRAMAMINE) 50 MG tablet Take 1 tablet (50 mg total) by mouth every 8 (eight) hours as needed.   etonogestrel-ethinyl estradiol (NUVARING) 0.12-0.015 MG/24HR vaginal ring Insert vaginally and leave in place for 3 consecutive weeks, then remove for 1 week.   fluticasone (FLONASE) 50 MCG/ACT nasal spray Place 1 spray into both nostrils daily.   lisinopril-hydrochlorothiazide (ZESTORETIC) 20-25 MG tablet Take 1 tablet by mouth daily.   TRELEGY ELLIPTA 200-62.5-25 MCG/INH AEPB pulm       08/22/2022    3:23 PM 07/21/2022    1:43 PM  01/17/2022    3:06 PM 05/08/2020   10:22 AM  GAD 7 : Generalized Anxiety Score  Nervous, Anxious, on Edge 0 0 3 0  Control/stop worrying 0 0 2 0  Worry too much - different things 0 0 2 0  Trouble relaxing 0 0 2 2  Restless 0 0 2 0  Easily annoyed or irritable 0 0 2 0  Afraid - awful might happen 0 0 1 0  Total GAD 7 Score 0 0 14 2  Anxiety Difficulty Not difficult at all Not difficult at all Somewhat difficult Not difficult at all       08/22/2022    3:23 PM 07/21/2022    1:43 PM 01/17/2022    3:06 PM  Depression screen PHQ 2/9  Decreased Interest 0 0 0  Down, Depressed, Hopeless 0 0 0  PHQ - 2 Score 0 0 0  Altered sleeping 0 0 2  Tired, decreased energy 0 0 2  Change in appetite 0 0 0  Feeling bad or failure about yourself  0 0 0   Trouble concentrating 0 0 1  Moving slowly or fidgety/restless 0 0 0  Suicidal thoughts 0 0 0  PHQ-9 Score 0 0 5  Difficult doing work/chores Not difficult at all Not difficult at all Not difficult at all    BP Readings from Last 3 Encounters:  08/22/22 126/74  08/19/22 (!) 136/92  08/12/22 (!) 155/100    Physical Exam Vitals and nursing note reviewed. Exam conducted with a chaperone present.  Constitutional:      General: She is not in acute distress.    Appearance: She is not diaphoretic.  HENT:     Head: Normocephalic and atraumatic.     Right Ear: Tympanic membrane and external ear normal.     Left Ear: Tympanic membrane and external ear normal.     Nose: Nose normal.     Mouth/Throat:     Mouth: Mucous membranes are moist.  Eyes:     General:        Right eye: No discharge.        Left eye: No discharge.     Conjunctiva/sclera: Conjunctivae normal.     Pupils: Pupils are equal, round, and reactive to light.  Neck:     Thyroid: No thyromegaly.     Vascular: No JVD.  Cardiovascular:     Rate and Rhythm: Normal rate and regular rhythm.     Heart sounds: Normal heart sounds. No murmur heard.    No friction rub. No gallop.  Pulmonary:     Effort: Pulmonary effort is normal.     Breath sounds: Normal breath sounds. No wheezing, rhonchi or rales.  Abdominal:     General: Bowel sounds are normal.     Palpations: Abdomen is soft. There is no mass.     Tenderness: There is no abdominal tenderness. There is no guarding.  Musculoskeletal:        General: Normal range of motion.     Cervical back: Normal range of motion and neck supple.  Lymphadenopathy:     Cervical: No cervical adenopathy.  Skin:    General: Skin is warm and dry.  Neurological:     Mental Status: She is alert.     Deep Tendon Reflexes: Reflexes are normal and symmetric.     Wt Readings from Last 3 Encounters:  08/22/22 184 lb (83.5 kg)  08/05/22 190 lb 1.6 oz (86.2 kg)  07/21/22 189 lb (  85.7  kg)    BP 126/74   Pulse 80   Ht '5\' 2"'$  (1.575 m)   Wt 184 lb (83.5 kg)   LMP 08/18/2022 (Approximate)   SpO2 97%   BMI 33.65 kg/m   Assessment and Plan:  1. Essential hypertension Chronic.  Controlled.  Stable.  Patient blood pressure today is 126/74.  Asymptomatic.  Tolerating medications well.  Patient has done well since we changed calcium channel blocker to diltiazem CD1 120 mg once a day with continuance of lisinopril hydrochlorothiazide 20-25 mg once a day.  Patient will be rechecked in 6 months. - diltiazem (CARDIZEM CD) 120 MG 24 hr capsule; Take 1 capsule (120 mg total) by mouth daily.  Dispense: 90 capsule; Refill: 1 - lisinopril-hydrochlorothiazide (ZESTORETIC) 20-25 MG tablet; Take 1 tablet by mouth daily.  Dispense: 90 tablet; Refill: 1    Otilio Miu, MD

## 2022-08-26 ENCOUNTER — Inpatient Hospital Stay: Payer: BC Managed Care – PPO

## 2022-08-26 VITALS — BP 110/61 | HR 99 | Temp 98.6°F | Resp 18

## 2022-08-26 DIAGNOSIS — R5383 Other fatigue: Secondary | ICD-10-CM | POA: Diagnosis not present

## 2022-08-26 DIAGNOSIS — Z79899 Other long term (current) drug therapy: Secondary | ICD-10-CM | POA: Diagnosis not present

## 2022-08-26 DIAGNOSIS — Z87891 Personal history of nicotine dependence: Secondary | ICD-10-CM | POA: Diagnosis not present

## 2022-08-26 DIAGNOSIS — D508 Other iron deficiency anemias: Secondary | ICD-10-CM

## 2022-08-26 DIAGNOSIS — D509 Iron deficiency anemia, unspecified: Secondary | ICD-10-CM | POA: Diagnosis not present

## 2022-08-26 MED ORDER — SODIUM CHLORIDE 0.9 % IV SOLN
Freq: Once | INTRAVENOUS | Status: AC
  Filled 2022-08-26: qty 250

## 2022-08-26 MED ORDER — SODIUM CHLORIDE 0.9 % IV SOLN
200.0000 mg | Freq: Once | INTRAVENOUS | Status: AC
  Administered 2022-08-26: 200 mg via INTRAVENOUS
  Filled 2022-08-26: qty 200

## 2022-08-26 NOTE — Patient Instructions (Signed)

## 2022-09-02 ENCOUNTER — Inpatient Hospital Stay: Payer: BC Managed Care – PPO

## 2022-09-02 VITALS — BP 134/94 | HR 103 | Temp 98.6°F

## 2022-09-02 DIAGNOSIS — Z79899 Other long term (current) drug therapy: Secondary | ICD-10-CM | POA: Diagnosis not present

## 2022-09-02 DIAGNOSIS — Z87891 Personal history of nicotine dependence: Secondary | ICD-10-CM | POA: Diagnosis not present

## 2022-09-02 DIAGNOSIS — D508 Other iron deficiency anemias: Secondary | ICD-10-CM

## 2022-09-02 DIAGNOSIS — D509 Iron deficiency anemia, unspecified: Secondary | ICD-10-CM | POA: Diagnosis not present

## 2022-09-02 DIAGNOSIS — R5383 Other fatigue: Secondary | ICD-10-CM | POA: Diagnosis not present

## 2022-09-02 MED ORDER — SODIUM CHLORIDE 0.9 % IV SOLN
200.0000 mg | Freq: Once | INTRAVENOUS | Status: AC
  Administered 2022-09-02: 200 mg via INTRAVENOUS
  Filled 2022-09-02: qty 200

## 2022-09-02 MED ORDER — SODIUM CHLORIDE 0.9 % IV SOLN
Freq: Once | INTRAVENOUS | Status: AC
  Filled 2022-09-02: qty 250

## 2022-09-28 ENCOUNTER — Ambulatory Visit
Admission: RE | Admit: 2022-09-28 | Discharge: 2022-09-28 | Disposition: A | Discharge status: Home / Self Care | Payer: BC Managed Care – PPO | Source: Ambulatory Visit | Attending: Urgent Care | Admitting: Urgent Care

## 2022-09-28 VITALS — BP 151/106 | HR 116 | Temp 98.8°F | Ht 62.0 in | Wt 179.0 lb

## 2022-09-28 DIAGNOSIS — T7840XA Allergy, unspecified, initial encounter: Secondary | ICD-10-CM | POA: Diagnosis not present

## 2022-09-28 DIAGNOSIS — R220 Localized swelling, mass and lump, head: Secondary | ICD-10-CM

## 2022-09-28 DIAGNOSIS — R0609 Other forms of dyspnea: Secondary | ICD-10-CM

## 2022-09-28 DIAGNOSIS — R1310 Dysphagia, unspecified: Secondary | ICD-10-CM | POA: Diagnosis not present

## 2022-09-28 MED ORDER — METHYLPREDNISOLONE 4 MG PO TBPK: ORAL_TABLET | ORAL | 0 refills | Status: DC

## 2022-09-28 MED ORDER — METHYLPREDNISOLONE ACETATE 80 MG/ML IJ SUSP
80.0000 mg | Freq: Once | INTRAMUSCULAR | Status: AC
  Administered 2022-09-28: 80 mg via INTRAMUSCULAR

## 2022-09-28 NOTE — ED Triage Notes (Addendum)
Pt c/o facial swelling onset 4:30am this morning, pt states swelling started at lips. Pt states she did a sleeping pill last night but has taken them before with no reaction. Pt states she did take benadryl 6:30-7am this morning. Pt denies any trouble breathing, no trouble swallowing.

## 2022-09-28 NOTE — Discharge Instructions (Signed)
Follow up here or with your primary care provider if your symptoms are worsening or not improving.  Go to the ED if you have difficulty speaking, swallowing or breathing.

## 2022-09-28 NOTE — ED Provider Notes (Signed)
[facility]    CSN: [id number] Arrival date & time: 09/28/22  1311      History   Chief Complaint Chief Complaint  Patient presents with   Allergic Reaction   Oral Swelling   Facial Swelling    HPI Jessica Humphrey is a 37 y.o. female.    Allergic Reaction   Patient presents to urgent care with complaint of facial swelling onset this morning at about 4:30 AM.  She states the swelling started at her lips and has spread to her cheeks and the rest of her face.  She denies difficulty breathing or swallowing.  She endorses taking a sleeping pill last night but has tolerated this medication previously without any reaction.  She took a dose of Benadryl at approximately 630 this morning.  Denies any known allergies or contact with allergens.  History of asthma.  Past Medical History:  Diagnosis Date   Asthma    Hypertension     Patient Active Problem List   Diagnosis Date Noted   IDA (iron deficiency anemia) 08/04/2022   HTN (hypertension), benign 11/03/2015   Asthma exacerbation 04/01/2015    Past Surgical History:  Procedure Laterality Date   CESAREAN SECTION     x 1    OB History   No obstetric history on file.      Home Medications    Prior to Admission medications   Medication Sig Start Date End Date Taking? Authorizing Provider  albuterol (VENTOLIN HFA) 108 (90 Base) MCG/ACT inhaler Inhale 2 puffs into the lungs every 6 (six) hours as needed for wheezing or shortness of breath. 01/17/22   Duanne LimerickJones, Deanna C, MD  diltiazem (CARDIZEM CD) 120 MG 24 hr capsule Take 1 capsule (120 mg total) by mouth daily. 08/22/22   Duanne LimerickJones, Deanna C, MD  dimenhyDRINATE (DRAMAMINE) 50 MG tablet Take 1 tablet (50 mg total) by mouth every 8 (eight) hours as needed. 03/04/22   Valinda HoarWhite, Adrienne R, NP  etonogestrel-ethinyl estradiol (NUVARING) 0.12-0.015 MG/24HR vaginal ring Insert vaginally and leave in place for 3 consecutive weeks, then remove for 1 week. 03/08/22    Linzie CollinJackson-Evans, Catherine, MD  fluticasone (FLONASE) 50 MCG/ACT nasal spray Place 1 spray into both nostrils daily. 03/04/22   Valinda HoarWhite, Adrienne R, NP  Iron, Ferrous Sulfate, 325 (65 Fe) MG TABS Take 325 mg by mouth daily. Patient not taking: Reported on 07/21/2022 01/19/22   Duanne LimerickJones, Deanna C, MD  lisinopril-hydrochlorothiazide (ZESTORETIC) 20-25 MG tablet Take 1 tablet by mouth daily. 08/22/22   Duanne LimerickJones, Deanna C, MD  meclizine (ANTIVERT) 25 MG tablet Take 25 mg by mouth every 6 (six) hours as needed. Patient not taking: Reported on 08/22/2022 02/28/22   [provider]  Dwyane LuoRELEGY ELLIPTA 200-62.5-25 MCG/INH AEPB pulm 05/04/20   [provider]    Family History Family History  Problem Relation Age of Onset   Hypertension Mother    Cervical cancer Mother    Hypertension Father    Diabetes Maternal Grandmother    Diabetes Paternal Grandmother     Social History Social History   Tobacco Use   Smoking status: Former    Types: Cigarettes    Quit date: 04/12/2018    Years since quitting: 4.4   Smokeless tobacco: Never   Tobacco comments:    Very light smoker, about 1 cig a day   Vaping Use   Vaping Use: Never used  Substance Use Topics   Alcohol use: Not Currently    Comment: rare  Drug use: Never     Allergies   Patient has no known allergies.   Review of Systems Review of Systems   Physical Exam Triage Vital Signs ED Triage Vitals  Enc Vitals Group     BP 09/28/22 1358 (!) 151/106     Pulse Rate 09/28/22 1358 (!) 116     Resp --      Temp 09/28/22 1358 98.8 F (37.1 C)     Temp Source 09/28/22 1358 Oral     SpO2 09/28/22 1358 99 %     Weight 09/28/22 1357 179 lb (81.2 kg)     Height 09/28/22 1357 5\' 2"  (1.575 m)     Head Circumference --      Peak Flow --      Pain Score 09/28/22 1357 0     Pain Loc --      Pain Edu? --      Excl. in GC? --    No data found.  Updated Vital Signs BP (!) 151/106 (BP Location: Left Arm)   Pulse (!) 116   Temp 98.8  F (37.1 C) (Oral)   Ht 5\' 2"  (1.575 m)   Wt 179 lb (81.2 kg)   LMP 08/28/2022 (Approximate)   SpO2 99%   BMI 32.74 kg/m   Visual Acuity Right Eye Distance:   Left Eye Distance:   Bilateral Distance:    Right Eye Near:   Left Eye Near:    Bilateral Near:     Physical Exam Vitals reviewed.  Constitutional:      Appearance: Normal appearance.  HENT:     Head:      Comments: Full swelling around the patient's lips and cheeks is present.  Some difficulty speaking. Skin:    General: Skin is warm and dry.  Neurological:     General: No focal deficit present.     Mental Status: She is alert and oriented to person, place, and time.  Psychiatric:        Mood and Affect: Mood normal.        Behavior: Behavior normal.     UC Treatments / Results  Labs (all labs ordered are listed, but only abnormal results are displayed) Labs Reviewed - No data to display  EKG   Radiology No results found.  Procedures Procedures (including critical care time)  Medications Ordered in UC Medications  methylPREDNISolone acetate (DEPO-MEDROL) injection 80 mg (has no administration in time range)    Initial Impression / Assessment and Plan / UC Course  I have reviewed the triage vital signs and the nursing notes.  Pertinent labs & imaging results that were available during my care of the patient were reviewed by me and considered in my medical decision making (see chart for details).   Jessica Humphrey is a 37 y.o. female presenting with acute facial swelling. Patient is afebrile without recent antipyretics, satting well on room air. Overall is well appearing and non-toxic, well hydrated, without respiratory distress.  Evident facial swelling and redness present at her lips and surrounding tissue including cheeks and chin.  Some dysarthria and so possibly tongue swelling as well.  Denies difficulty breathing or swallowing currently.  Idiopathic allergic reaction, unknown allergen.   Some concern for development of difficulty breathing/swallowing and so will provide treatment with corticosteroid in clinic and assess response before discharge.  Endorses improved symptoms after treatment with methylprednisolone in clinic.  Discharged with a prescription for Medrol Dosepak to be started tomorrow.  Counseled  patient on potential for adverse effects with medications prescribed/recommended today, ER and return-to-clinic precautions discussed, patient verbalized understanding and agreement with care plan.   Final Clinical Impressions(s) / UC Diagnoses   Final diagnoses:  Allergic reaction, initial encounter   Discharge Instructions   None    ED Prescriptions   None    PDMP not reviewed this encounter.   Charma Igo, [place] 09/28/22 1524

## 2022-11-03 ENCOUNTER — Inpatient Hospital Stay: Payer: BC Managed Care – PPO | Attending: Oncology

## 2022-11-03 DIAGNOSIS — Z79899 Other long term (current) drug therapy: Secondary | ICD-10-CM | POA: Insufficient documentation

## 2022-11-03 DIAGNOSIS — N920 Excessive and frequent menstruation with regular cycle: Secondary | ICD-10-CM | POA: Insufficient documentation

## 2022-11-03 DIAGNOSIS — D509 Iron deficiency anemia, unspecified: Secondary | ICD-10-CM | POA: Insufficient documentation

## 2022-11-03 DIAGNOSIS — Z793 Long term (current) use of hormonal contraceptives: Secondary | ICD-10-CM | POA: Insufficient documentation

## 2022-11-03 DIAGNOSIS — F5089 Other specified eating disorder: Secondary | ICD-10-CM | POA: Insufficient documentation

## 2022-11-03 DIAGNOSIS — R5383 Other fatigue: Secondary | ICD-10-CM | POA: Insufficient documentation

## 2022-11-03 DIAGNOSIS — Z8249 Family history of ischemic heart disease and other diseases of the circulatory system: Secondary | ICD-10-CM | POA: Insufficient documentation

## 2022-11-03 DIAGNOSIS — Z87891 Personal history of nicotine dependence: Secondary | ICD-10-CM | POA: Insufficient documentation

## 2022-11-03 DIAGNOSIS — Z833 Family history of diabetes mellitus: Secondary | ICD-10-CM | POA: Insufficient documentation

## 2022-11-03 DIAGNOSIS — Z8049 Family history of malignant neoplasm of other genital organs: Secondary | ICD-10-CM | POA: Insufficient documentation

## 2022-11-04 MED FILL — Iron Sucrose Inj 20 MG/ML (Fe Equiv): INTRAVENOUS | Qty: 10 | Status: AC

## 2022-11-07 ENCOUNTER — Inpatient Hospital Stay (HOSPITAL_BASED_OUTPATIENT_CLINIC_OR_DEPARTMENT_OTHER): Payer: BC Managed Care – PPO | Admitting: Oncology

## 2022-11-07 ENCOUNTER — Inpatient Hospital Stay: Payer: BC Managed Care – PPO

## 2022-11-07 ENCOUNTER — Encounter: Payer: Self-pay | Admitting: Oncology

## 2022-11-07 VITALS — BP 159/120 | HR 127 | Temp 98.6°F | Wt 181.1 lb

## 2022-11-07 DIAGNOSIS — Z8249 Family history of ischemic heart disease and other diseases of the circulatory system: Secondary | ICD-10-CM | POA: Diagnosis not present

## 2022-11-07 DIAGNOSIS — N920 Excessive and frequent menstruation with regular cycle: Secondary | ICD-10-CM

## 2022-11-07 DIAGNOSIS — Z87891 Personal history of nicotine dependence: Secondary | ICD-10-CM | POA: Diagnosis not present

## 2022-11-07 DIAGNOSIS — D508 Other iron deficiency anemias: Secondary | ICD-10-CM

## 2022-11-07 DIAGNOSIS — F5089 Other specified eating disorder: Secondary | ICD-10-CM | POA: Diagnosis not present

## 2022-11-07 DIAGNOSIS — Z79899 Other long term (current) drug therapy: Secondary | ICD-10-CM | POA: Diagnosis not present

## 2022-11-07 DIAGNOSIS — Z8049 Family history of malignant neoplasm of other genital organs: Secondary | ICD-10-CM | POA: Diagnosis not present

## 2022-11-07 DIAGNOSIS — R5383 Other fatigue: Secondary | ICD-10-CM | POA: Diagnosis not present

## 2022-11-07 DIAGNOSIS — D509 Iron deficiency anemia, unspecified: Secondary | ICD-10-CM | POA: Diagnosis not present

## 2022-11-07 DIAGNOSIS — Z793 Long term (current) use of hormonal contraceptives: Secondary | ICD-10-CM | POA: Diagnosis not present

## 2022-11-07 DIAGNOSIS — Z833 Family history of diabetes mellitus: Secondary | ICD-10-CM | POA: Diagnosis not present

## 2022-11-07 LAB — RETIC PANEL
Immature Retic Fract: 17.6 % — ABNORMAL HIGH (ref 2.3–15.9)
RBC.: 3.64 MIL/uL — ABNORMAL LOW (ref 3.87–5.11)
Retic Count, Absolute: 68.4 10*3/uL (ref 19.0–186.0)
Retic Ct Pct: 1.9 % (ref 0.4–3.1)
Reticulocyte Hemoglobin: 23.5 pg — ABNORMAL LOW (ref >27.9)

## 2022-11-07 LAB — IRON AND TIBC
Iron: 37 ug/dL (ref 28–170)
Saturation Ratios: 7 % — ABNORMAL LOW (ref 10.4–31.8)
TIBC: 563 ug/dL — ABNORMAL HIGH (ref 250–450)
UIBC: 526 ug/dL

## 2022-11-07 LAB — CBC WITH DIFFERENTIAL (CANCER CENTER ONLY)
Abs Immature Granulocytes: 0.06 10*3/uL (ref 0.00–0.07)
Basophils Absolute: 0 10*3/uL (ref 0.0–0.1)
Basophils Relative: 1 %
Eosinophils Absolute: 0.4 10*3/uL (ref 0.0–0.5)
Eosinophils Relative: 7 %
HCT: 30.4 % — ABNORMAL LOW (ref 36.0–46.0)
Hemoglobin: 9.5 g/dL — ABNORMAL LOW (ref 12.0–15.0)
Immature Granulocytes: 1 %
Lymphocytes Relative: 30 %
Lymphs Abs: 1.8 10*3/uL (ref 0.7–4.0)
MCH: 25.7 pg — ABNORMAL LOW (ref 26.0–34.0)
MCHC: 31.3 g/dL (ref 30.0–36.0)
MCV: 82.4 fL (ref 80.0–100.0)
Monocytes Absolute: 0.3 10*3/uL (ref 0.1–1.0)
Monocytes Relative: 6 %
Neutro Abs: 3.5 10*3/uL (ref 1.7–7.7)
Neutrophils Relative %: 55 %
Platelet Count: 459 10*3/uL — ABNORMAL HIGH (ref 150–400)
RBC: 3.69 MIL/uL — ABNORMAL LOW (ref 3.87–5.11)
RDW: 16.7 % — ABNORMAL HIGH (ref 11.5–15.5)
WBC Count: 6.1 10*3/uL (ref 4.0–10.5)
nRBC: 0 % (ref 0.0–0.2)

## 2022-11-07 LAB — FERRITIN: Ferritin: 6 ng/mL — ABNORMAL LOW (ref 11–307)

## 2022-11-07 NOTE — Progress Notes (Addendum)
Hematology/Oncology Progress note [phone] [phone]         Patient Care Team: Duanne Limerick, MD as PCP - General (Family Medicine)   REFERRING PROVIDER: Duanne Limerick, MD  CHIEF COMPLAINTS/REASON FOR VISIT:  Anemia  ASSESSMENT & PLAN:  IDA (iron deficiency anemia) Labs are reviewed and discussed with patient. S/p IV venofer treatments.  Lab Results  Component Value Date   HGB 9.5 (L) 11/07/2022   TIBC 563 (H) 11/07/2022   IRONPCTSAT 7 (L) 11/07/2022   FERRITIN 6 (L) 11/07/2022    Celiac panel pending.  She has persistent iron deficiency.  Recommend IV Venofer weekly x 5  Menorrhagia Recommend GYN work up  Orders Placed This Encounter  Procedures   CBC with Differential (Cancer Center Only)    Standing Status:   Future    Standing Expiration Date:   11/07/2023   Iron and TIBC    Standing Status:   Future    Standing Expiration Date:   11/07/2023   Ferritin    Standing Status:   Future    Standing Expiration Date:   11/07/2023   Retic Panel    Standing Status:   Future    Standing Expiration Date:   11/07/2023   Follow-up in 4 months All questions were answered. The patient knows to call the clinic with any problems, questions or concerns.  Rickard Patience, MD, PhD [facility] Hematology Oncology 11/07/2022     HISTORY OF PRESENTING ILLNESS:  Jessica Humphrey is a  37 y.o.  female with PMH listed below who was referred to me for anemia Reviewed patient's recent labs that was done.  She was found to have abnormal CBC since July 2023.  She had normal hemoglobin of 12 in 2017.  Mildly anemic with a hemoglobin of 11.8 in 2016. More recently 07/21/2022, hemoglobin 9.4, MCV 75, platelet count 542. 07/21/2022, ferritin 9. Patient reports feeling fatigued.  She denies recent chest pain on exertion, pre-syncopal episodes, or palpitations She had not noticed any recent bleeding such as epistaxis, hematuria or hematochezia.  Her menstrual  period is very light.  She has NuvaRing She denies over the counter NSAID ingestion.  + Pica  INTERVAL HISTORY EVALINA CREED is a 37 y.o. female who has above history reviewed by me today presents for follow up visit for iron deficiency anemia.  S/p IV Venofer weekly, she tolerates well.  Noticed heavy menstrual bleeding after last visit.  + fatigue.   MEDICAL HISTORY:  Past Medical History:  Diagnosis Date   Asthma    Hypertension     SURGICAL HISTORY: Past Surgical History:  Procedure Laterality Date   CESAREAN SECTION     x 1    SOCIAL HISTORY: Social History   Socioeconomic History   Marital status: Single    Spouse name: Not on file   Number of children: Not on file   Years of education: Not on file   Highest education level: Not on file  Occupational History   Not on file  Tobacco Use   Smoking status: Former    Types: Cigarettes    Quit date: 04/12/2018    Years since quitting: 4.5   Smokeless tobacco: Never   Tobacco comments:    Very light smoker, about 1 cig a day   Vaping Use   Vaping Use: Never used  Substance and Sexual Activity   Alcohol use: Not Currently    Comment: rare   Drug use: Never  Sexual activity: Yes  Other Topics Concern   Not on file  Social History Narrative   Not on file   Social Determinants of Health   Financial Resource Strain: Not on file  Food Insecurity: No Food Insecurity (08/05/2022)   Hunger Vital Sign    Worried About Running Out of Food in the Last Year: Never true    Ran Out of Food in the Last Year: Never true  Transportation Needs: Not on file  Physical Activity: Not on file  Stress: Not on file  Social Connections: Not on file  Intimate Partner Violence: Not At Risk (08/05/2022)   Humiliation, Afraid, Rape, and Kick questionnaire    Fear of Current or Ex-Partner: No    Emotionally Abused: No    Physically Abused: No    Sexually Abused: No    FAMILY HISTORY: Family History  Problem Relation  Age of Onset   Hypertension Mother    Cervical cancer Mother    Hypertension Father    Diabetes Maternal Grandmother    Diabetes Paternal Grandmother     ALLERGIES:  has No Known Allergies.  MEDICATIONS:  Current Outpatient Medications  Medication Sig Dispense Refill   albuterol (VENTOLIN HFA) 108 (90 Base) MCG/ACT inhaler Inhale 2 puffs into the lungs every 6 (six) hours as needed for wheezing or shortness of breath. 18 g 11   diltiazem (CARDIZEM CD) 120 MG 24 hr capsule Take 1 capsule (120 mg total) by mouth daily. 90 capsule 1   dimenhyDRINATE (DRAMAMINE) 50 MG tablet Take 1 tablet (50 mg total) by mouth every 8 (eight) hours as needed. 30 tablet 0   etonogestrel-ethinyl estradiol (NUVARING) 0.12-0.015 MG/24HR vaginal ring Insert vaginally and leave in place for 3 consecutive weeks, then remove for 1 week. 3 each 4   fluticasone (FLONASE) 50 MCG/ACT nasal spray Place 1 spray into both nostrils daily. 9.9 mL 0   lisinopril-hydrochlorothiazide (ZESTORETIC) 20-25 MG tablet Take 1 tablet by mouth daily. 90 tablet 1   meclizine (ANTIVERT) 25 MG tablet Take 25 mg by mouth every 6 (six) hours as needed.     TRELEGY ELLIPTA 200-62.5-25 MCG/INH AEPB pulm     No current facility-administered medications for this visit.    Review of Systems  Constitutional:  Positive for fatigue. Negative for appetite change, chills and fever.  HENT:   Negative for hearing loss and voice change.   Eyes:  Negative for eye problems.  Respiratory:  Negative for chest tightness and cough.   Cardiovascular:  Negative for chest pain.  Gastrointestinal:  Negative for abdominal distention, abdominal pain and blood in stool.  Endocrine: Negative for hot flashes.  Genitourinary:  Negative for difficulty urinating and frequency.   Musculoskeletal:  Negative for arthralgias.  Skin:  Negative for itching and rash.  Neurological:  Negative for extremity weakness.  Hematological:  Negative for adenopathy.   Psychiatric/Behavioral:  Negative for confusion.     PHYSICAL EXAMINATION: ECOG PERFORMANCE STATUS: 1 - Symptomatic but completely ambulatory Vitals:   11/07/22 1434  BP: (!) 159/120  Pulse: (!) 127  Temp: 98.6 F (37 C)  SpO2: (!) 18%   Filed Weights   11/07/22 1434  Weight: 181 lb 1.6 oz (82.1 kg)    Physical Exam Constitutional:      General: She is not in acute distress. HENT:     Head: Normocephalic and atraumatic.  Eyes:     General: No scleral icterus. Cardiovascular:     Rate and Rhythm: Normal rate and  regular rhythm.     Heart sounds: Normal heart sounds.  Pulmonary:     Effort: Pulmonary effort is normal. No respiratory distress.     Breath sounds: No wheezing.  Abdominal:     General: Bowel sounds are normal. There is no distension.     Palpations: Abdomen is soft.  Musculoskeletal:        General: No deformity. Normal range of motion.     Cervical back: Normal range of motion and neck supple.  Skin:    General: Skin is warm and dry.     Findings: No erythema or rash.  Neurological:     Mental Status: She is alert and oriented to person, place, and time. Mental status is at baseline.     Cranial Nerves: No cranial nerve deficit.     Coordination: Coordination normal.  Psychiatric:        Mood and Affect: Mood normal.      LABORATORY DATA:  I have reviewed the data as listed    Latest Ref Rng & Units 11/07/2022    3:25 PM 07/21/2022    2:34 PM 01/17/2022    3:39 PM  CBC  WBC 4.0 - 10.5 K/uL 6.1  7.6  8.3   Hemoglobin 12.0 - 15.0 g/dL 9.5  9.4  9.9   Hematocrit 36.0 - 46.0 % 30.4  30.3  32.8   Platelets 150 - 400 K/uL 459  542  519       Latest Ref Rng & Units 07/21/2022    2:34 PM 01/17/2022    3:39 PM 07/20/2021    9:36 AM  CMP  Glucose 70 - 99 mg/dL 161  97  096   BUN 6 - 20 mg/dL 8  17  9    Creatinine 0.57 - 1.00 mg/dL 0.45  4.09  8.11   Sodium 134 - 144 mmol/L 140  137  140   Potassium 3.5 - 5.2 mmol/L 3.5  4.4  4.2   Chloride 96 -  106 mmol/L 100  100  102   CO2 20 - 29 mmol/L 22  22  23    Calcium 8.7 - 10.2 mg/dL 9.6  91.4  9.6   Total Protein 6.0 - 8.5 g/dL 7.0     Total Bilirubin 0.0 - 1.2 mg/dL 0.4     Alkaline Phos 44 - 121 IU/L 56     AST 0 - 40 IU/L 22     ALT 0 - 32 IU/L 13         Component Value Date/Time   IRON 37 11/07/2022 1525   TIBC 563 (H) 11/07/2022 1525   FERRITIN 6 (L) 11/07/2022 1525   FERRITIN 9 (L) 07/21/2022 1434   IRONPCTSAT 7 (L) 11/07/2022 1525     RADIOGRAPHIC STUDIES: I have personally reviewed the radiological images as listed and agreed with the findings in the report. No results found.

## 2022-11-07 NOTE — Assessment & Plan Note (Signed)
Recommend GYN work up

## 2022-11-07 NOTE — Assessment & Plan Note (Addendum)
Labs are reviewed and discussed with patient. S/p IV venofer treatments.  Lab Results  Component Value Date   HGB 9.5 (L) 11/07/2022   TIBC 563 (H) 11/07/2022   IRONPCTSAT 7 (L) 11/07/2022   FERRITIN 6 (L) 11/07/2022    Celiac panel pending.  She has persistent iron deficiency.  Recommend IV Venofer weekly x 5

## 2022-11-08 ENCOUNTER — Other Ambulatory Visit: Payer: Self-pay

## 2022-11-08 ENCOUNTER — Telehealth: Payer: Self-pay

## 2022-11-08 NOTE — Telephone Encounter (Signed)
-----   Message from Rickard Patience, MD sent at 11/07/2022  7:54 PM EDT ----- Iron deficiency anemia.  Please arrange her to get IV venofer weekly x 5 Please move her appt up to 4 months.lab MD same day thanks.

## 2022-11-08 NOTE — Telephone Encounter (Signed)
Called patient and left message on voicemail informing her that her labs showed IDA and that Dr. Cathie Hoops would like to arrange her to get IV Venofer weekly x 5 weeks. Also informed her that she would like to move up her appointment to 4 months with labs on the same day. I informed patient that I would have scheduling to schedule and notify her of dates and times.

## 2022-11-09 LAB — CELIAC PANEL 10
Antigliadin Abs, IgA: 6 units (ref 0–19)
Endomysial Ab, IgA: NEGATIVE
Gliadin IgG: 2 units (ref 0–19)
IgA: 299 mg/dL (ref 87–352)
Tissue Transglut Ab: <2 U/mL (ref 0–5)
Tissue Transglutaminase Ab, IgA: <2 U/mL (ref 0–3)

## 2022-11-22 MED FILL — Iron Sucrose Inj 20 MG/ML (Fe Equiv): INTRAVENOUS | Qty: 10 | Status: AC

## 2022-11-23 ENCOUNTER — Inpatient Hospital Stay: Payer: BC Managed Care – PPO | Attending: Oncology

## 2023-01-09 IMAGING — US US OB < 14 WEEKS - US OB TV
1 series · 13 of 28 positions shown · non-contrast
Comparison: None.

CLINICAL DATA: Vaginal bleeding for 2 weeks

EXAM:
OBSTETRIC <14 WK US AND TRANSVAGINAL OB US
TECHNIQUE: Both transabdominal and transvaginal ultrasound examinations were
performed for complete evaluation of the gestation as well as the
maternal uterus, adnexal regions, and pelvic cul-de-sac.
Transvaginal technique was performed to assess early pregnancy.

[Series 1: ob us · 13 of 78 slices shown]
[im 3/78]
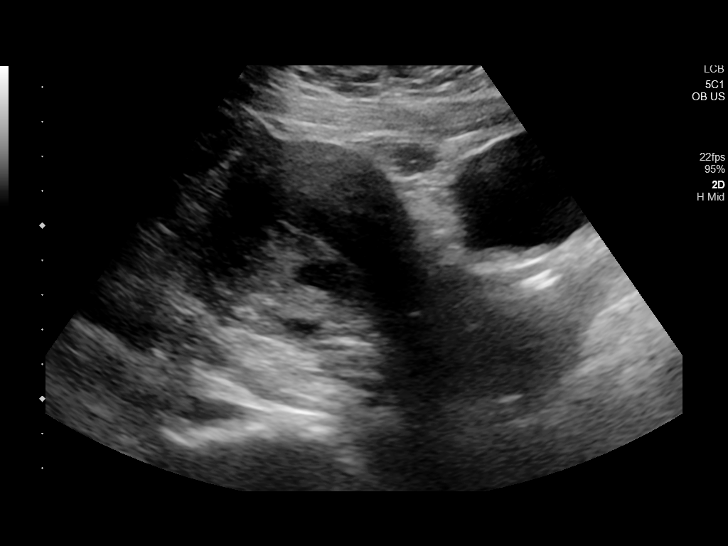
[im 9/78]
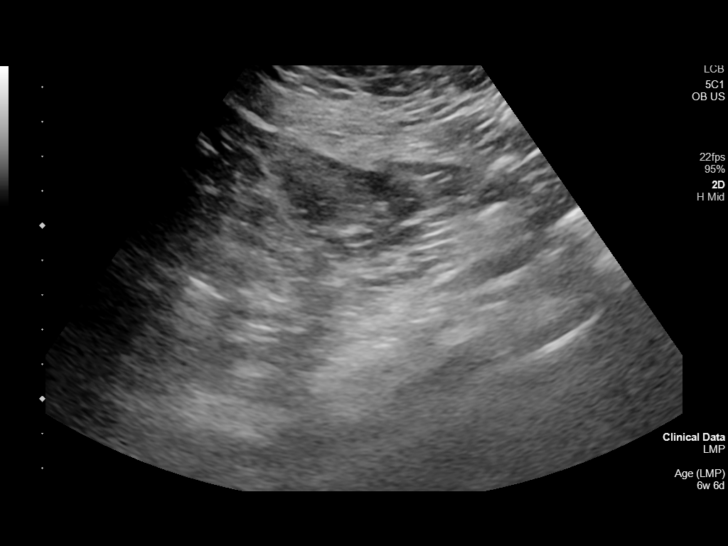
[im 15/78]
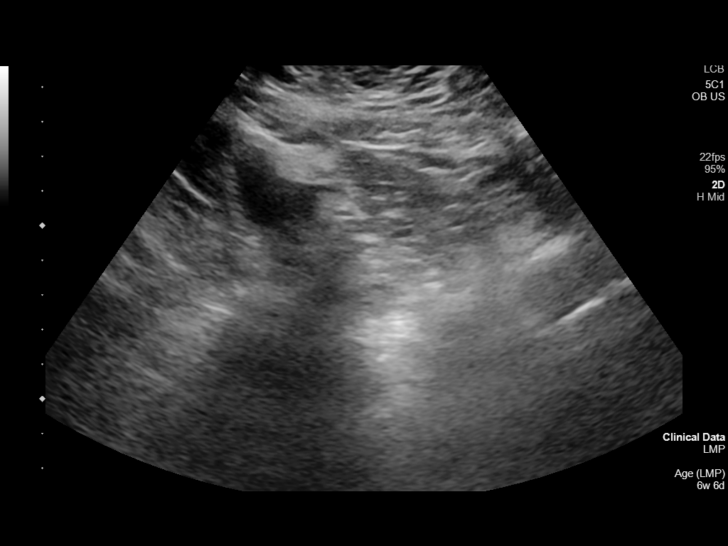
[im 20/78]
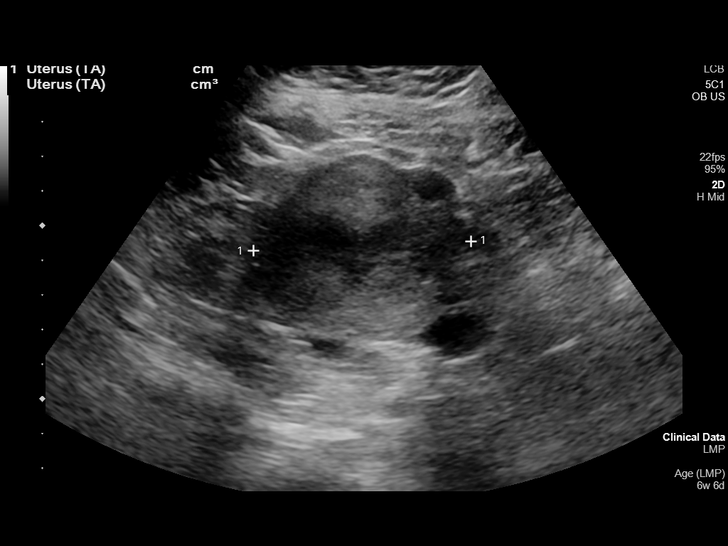
[im 26/78]
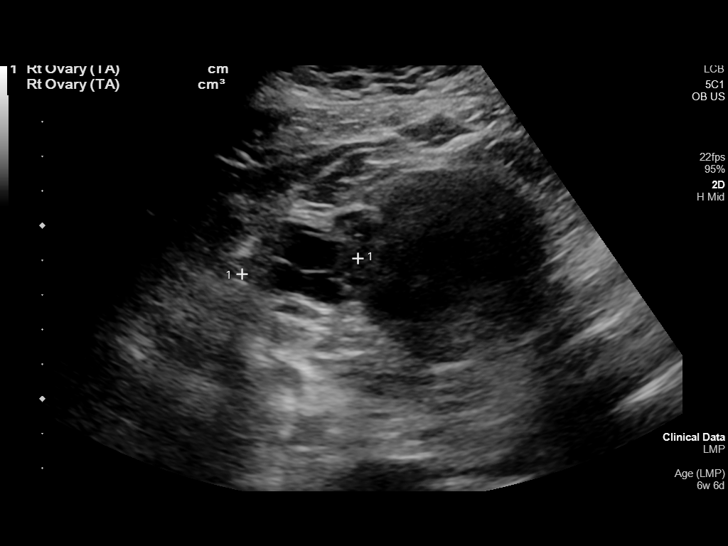
[im 32/78]
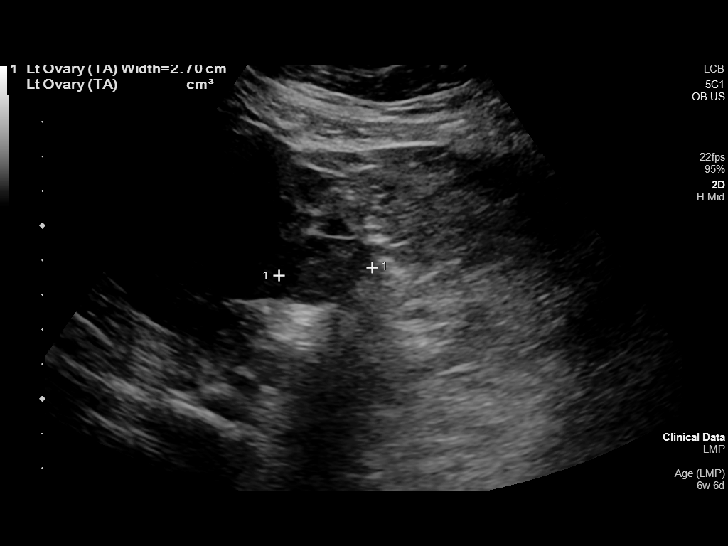
[im 40/78]
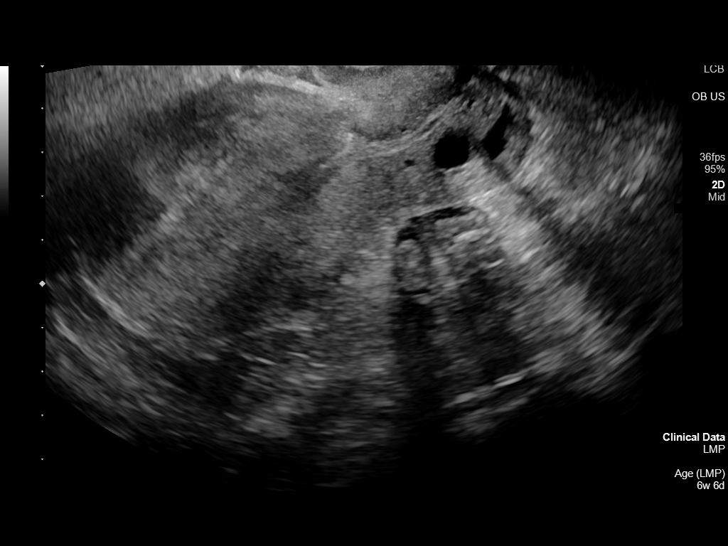
[im 46/78]
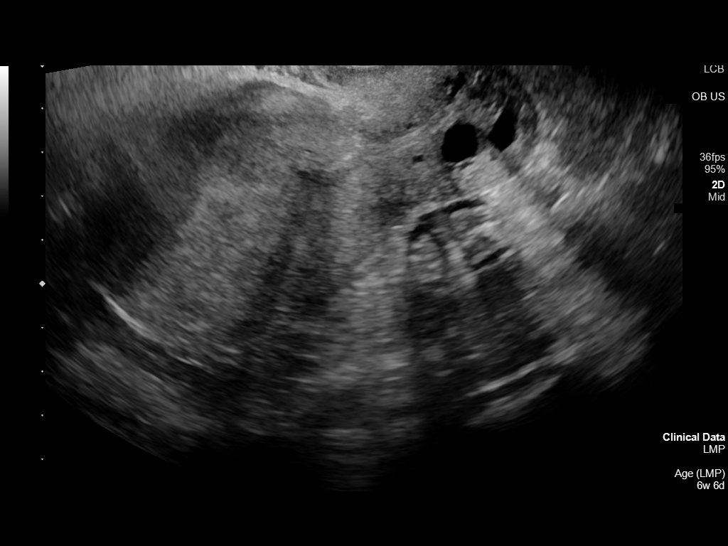
[im 52/78]
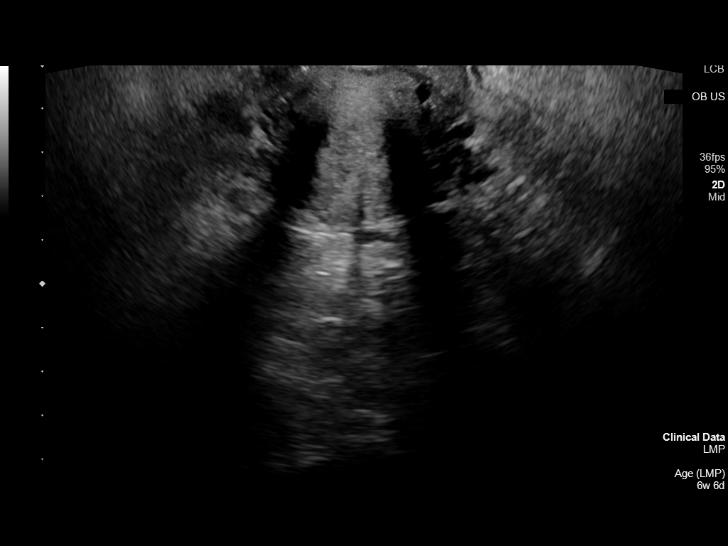
[im 58/78]
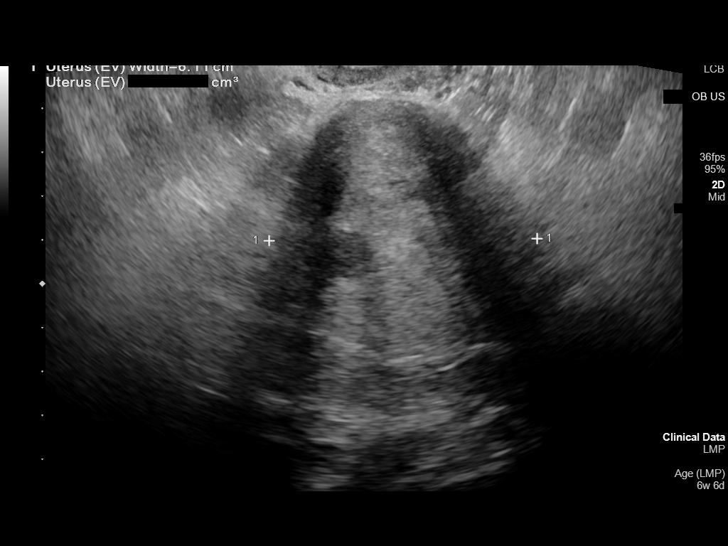
[im 63/78]
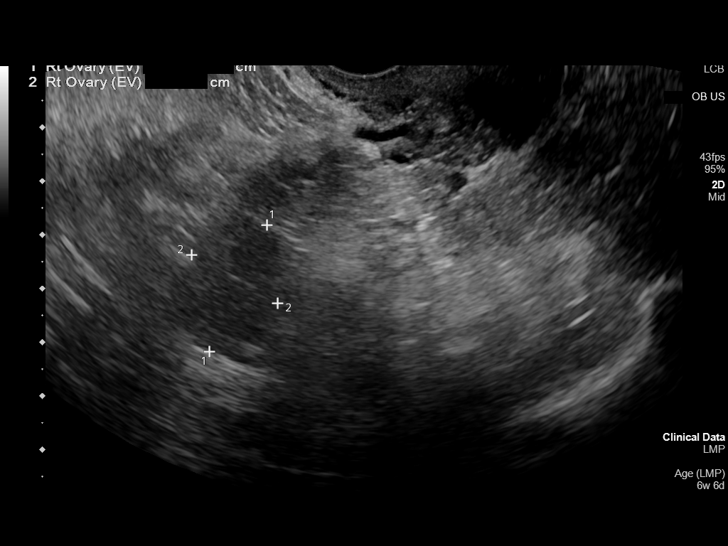
[im 69/78]
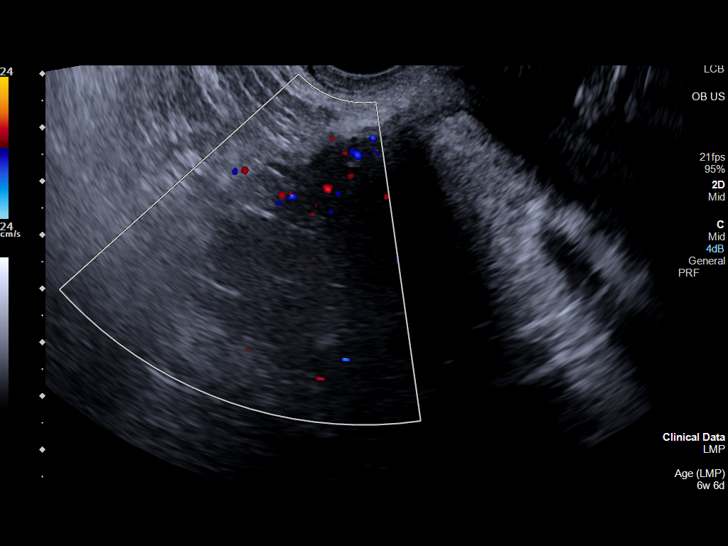
[im 75/78]
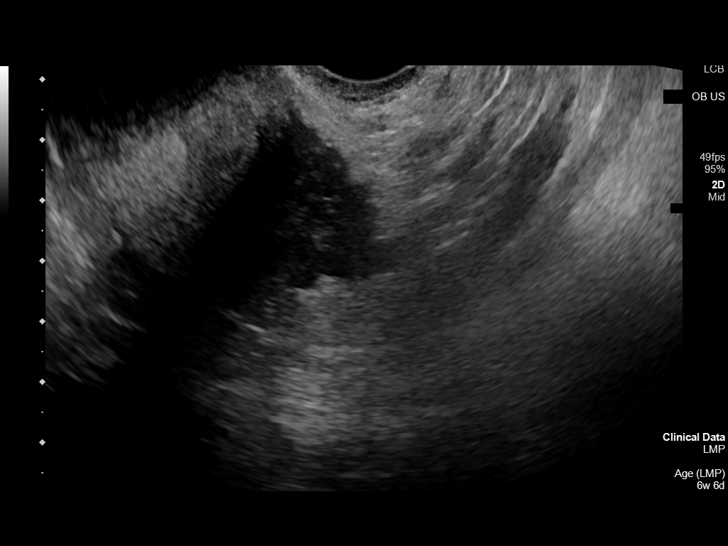

[13 of 28 positions shown; findings below may reference images not displayed]

FINDINGS: Intrauterine gestational sac: None

Yolk sac:  Not Visualized.

Embryo:  Not Visualized.

LMP: 06/06/20. Gestational age by LMP is 6 weeks 6 days. EDC by LMP
is 03/13/21.

Right ovary: Suboptimally visualized. It measures 2.4 x 2.9 x
cm.

Left ovary: Suboptimally visualized. It measures 2.2 x 2.0 x 2.7 cm.

Other :There is a posterior fibroid which measures 1.5 x 1.2 x
cm.

Free fluid:  None
IMPRESSION: No intrauterine gestational sac, yolk sac, or fetal pole identified.
In the setting of positive pregnancy test and no definite
intrauterine pregnancy, this reflects a pregnancy of unknown
location. Differential considerations include early normal IUP,
abnormal IUP, or nonvisualized ectopic pregnancy. Differentiation is
achieved with serial beta HCG supplemented by repeat sonography as
clinically warranted.

## 2023-01-17 ENCOUNTER — Telehealth: Payer: Self-pay

## 2023-01-17 ENCOUNTER — Encounter: Payer: Self-pay | Admitting: Emergency Medicine

## 2023-01-17 ENCOUNTER — Emergency Department
Admission: EM | Admit: 2023-01-17 | Discharge: 2023-01-17 | Disposition: A | Discharge status: Home / Self Care | Payer: BC Managed Care – PPO | Attending: Emergency Medicine | Admitting: Emergency Medicine

## 2023-01-17 ENCOUNTER — Emergency Department: Payer: BC Managed Care – PPO

## 2023-01-17 ENCOUNTER — Other Ambulatory Visit: Payer: Self-pay

## 2023-01-17 DIAGNOSIS — I10 Essential (primary) hypertension: Secondary | ICD-10-CM | POA: Insufficient documentation

## 2023-01-17 DIAGNOSIS — R000 Tachycardia, unspecified: Secondary | ICD-10-CM | POA: Insufficient documentation

## 2023-01-17 DIAGNOSIS — D649 Anemia, unspecified: Secondary | ICD-10-CM | POA: Diagnosis not present

## 2023-01-17 DIAGNOSIS — E876 Hypokalemia: Secondary | ICD-10-CM | POA: Insufficient documentation

## 2023-01-17 DIAGNOSIS — R079 Chest pain, unspecified: Secondary | ICD-10-CM | POA: Diagnosis not present

## 2023-01-17 LAB — BASIC METABOLIC PANEL
Anion gap: 10 (ref 5–15)
BUN: 8 mg/dL (ref 6–20)
CO2: 23 mmol/L (ref 22–32)
Calcium: 8.9 mg/dL (ref 8.9–10.3)
Chloride: 104 mmol/L (ref 98–111)
Creatinine, Ser: 0.8 mg/dL (ref 0.44–1.00)
GFR, Estimated: >60 mL/min (ref >60)
Glucose, Bld: 118 mg/dL — ABNORMAL HIGH (ref 70–99)
Potassium: 2.9 mmol/L — ABNORMAL LOW (ref 3.5–5.1)
Sodium: 137 mmol/L (ref 135–145)

## 2023-01-17 LAB — CBC
HCT: 27 % — ABNORMAL LOW (ref 36.0–46.0)
Hemoglobin: 8.1 g/dL — ABNORMAL LOW (ref 12.0–15.0)
MCH: 21.5 pg — ABNORMAL LOW (ref 26.0–34.0)
MCHC: 30 g/dL (ref 30.0–36.0)
MCV: 71.8 fL — ABNORMAL LOW (ref 80.0–100.0)
Platelets: 524 10*3/uL — ABNORMAL HIGH (ref 150–400)
RBC: 3.76 MIL/uL — ABNORMAL LOW (ref 3.87–5.11)
RDW: 18.2 % — ABNORMAL HIGH (ref 11.5–15.5)
WBC: 14.1 10*3/uL — ABNORMAL HIGH (ref 4.0–10.5)
nRBC: 0 % (ref 0.0–0.2)

## 2023-01-17 LAB — T4, FREE: Free T4: 0.92 ng/dL (ref 0.61–1.12)

## 2023-01-17 LAB — POC URINE PREG, ED: Preg Test, Ur: NEGATIVE

## 2023-01-17 LAB — MAGNESIUM: Magnesium: 1.5 mg/dL — ABNORMAL LOW (ref 1.7–2.4)

## 2023-01-17 LAB — TROPONIN I (HIGH SENSITIVITY)
Troponin I (High Sensitivity): 8 ng/L (ref <18)
Troponin I (High Sensitivity): 9 ng/L (ref <18)

## 2023-01-17 LAB — D-DIMER, QUANTITATIVE: D-Dimer, Quant: 0.98 ug/mL-FEU — ABNORMAL HIGH (ref 0.00–0.50)

## 2023-01-17 LAB — TSH: TSH: 1.779 u[IU]/mL (ref 0.350–4.500)

## 2023-01-17 MED ORDER — MAGNESIUM SULFATE 2 GM/50ML IV SOLN
2.0000 g | INTRAVENOUS | Status: AC
  Administered 2023-01-17: 2 g via INTRAVENOUS
  Filled 2023-01-17: qty 50

## 2023-01-17 MED ORDER — POTASSIUM CHLORIDE CRYS ER 20 MEQ PO TBCR
60.0000 meq | EXTENDED_RELEASE_TABLET | Freq: Once | ORAL | Status: AC
  Administered 2023-01-17: 60 meq via ORAL
  Filled 2023-01-17: qty 3

## 2023-01-17 MED ORDER — SODIUM CHLORIDE 0.9 % IV SOLN
200.0000 mg | INTRAVENOUS | Status: AC
  Administered 2023-01-17: 200 mg via INTRAVENOUS
  Filled 2023-01-17: qty 200

## 2023-01-17 NOTE — Transitions of Care (Post Inpatient/ED Visit) (Signed)
   01/17/2023  Name: EVAH BUTRICK MRN: [id number] DOB: 01/04/1986  Today's TOC FU Call Status: Today's TOC FU Call Status:: Unsuccessul Call (1st Attempt) Unsuccessful Call (1st Attempt) Date: 01/17/23  Attempted to reach the patient regarding the most recent Inpatient/ED visit.  Follow Up Plan: Additional outreach attempts will be made to reach the patient to complete the Transitions of Care (Post Inpatient/ED visit) call.   Signature Arthur Holms

## 2023-01-17 NOTE — ED Provider Notes (Signed)
[facility] Provider Note    Event Date/Time   First MD Initiated Contact with Patient 01/17/23 [phone]     (approximate)   History   Chief Complaint: Tachycardia   HPI  Jessica Humphrey is a 37 y.o. female with a history of hypertension, iron deficiency anemia, menorrhagia who comes ED complaining of heart racing.  She noted a heart rate as high as 130 at home.  Denies chest pain or shortness of breath.  No dizziness, no orthostatic symptoms.  She has been prescribed diltiazem XR for many months.  She reports that she had not been taking the medication but after her symptoms started this morning she did take a dose.  Reviewed outside records noting that for her iron deficiency anemia she was given weekly Venofer infusions through May of this year.  Patient reports that she is not consistently taking an oral iron supplement after that.  She does feel that her menorrhagia has improved, on exogenous hormones.     Physical Exam   Triage Vital Signs: ED Triage Vitals  Encounter Vitals Group     BP 01/17/23 0231 (!) 192/90     Systolic BP Percentile --      Diastolic BP Percentile --      Pulse Rate 01/17/23 0231 (!) 134     Resp 01/17/23 0231 18     Temp 01/17/23 0231 98.4 F (36.9 C)     Temp Source 01/17/23 0231 Oral     SpO2 01/17/23 0231 99 %     Weight 01/17/23 0230 178 lb (80.7 kg)     Height 01/17/23 0230 5\' 2"  (1.575 m)     Head Circumference --      Peak Flow --      Pain Score 01/17/23 0230 0     Pain Loc --      Pain Education --      Exclude from Growth Chart --     Most recent vital signs: Vitals:   01/17/23 0830 01/17/23 0900  BP: 123/67 (!) 142/75  Pulse: 93 (!) 106  Resp: 14 17  Temp:    SpO2: 98% 98%    General: Awake, no distress.  CV:  Good peripheral perfusion.  Tachycardia heart rate 100, normal distal pulses Resp:  Normal effort.  Clear to auscultation bilaterally Abd:  No distention.  Soft nontender Other:  No  lower extremity edema, no calf tenderness.  Symmetric calf circumference.  Moist oral mucosa   ED Results / Procedures / Treatments   Labs (all labs ordered are listed, but only abnormal results are displayed) Labs Reviewed  BASIC METABOLIC PANEL - Abnormal; Notable for the following components:      Result Value   Potassium 2.9 (*)    Glucose, Bld 118 (*)    All other components within normal limits  CBC - Abnormal; Notable for the following components:   WBC 14.1 (*)    RBC 3.76 (*)    Hemoglobin 8.1 (*)    HCT 27.0 (*)    MCV 71.8 (*)    MCH 21.5 (*)    RDW 18.2 (*)    Platelets 524 (*)    All other components within normal limits  MAGNESIUM - Abnormal; Notable for the following components:   Magnesium 1.5 (*)    All other components within normal limits  D-DIMER, QUANTITATIVE - Abnormal; Notable for the following components:   D-Dimer, Quant 0.98 (*)    All other components  within normal limits  T4, FREE  TSH  POC URINE PREG, ED  TROPONIN I (HIGH SENSITIVITY)  TROPONIN I (HIGH SENSITIVITY)     EKG Interpreted by me Sinus tachycardia rate 128.  Normal axis intervals QRS ST segments and T waves   RADIOLOGY Chest x-ray interpreted by me, appears normal.  Radiology report reviewed   PROCEDURES:  Procedures   MEDICATIONS ORDERED IN ED: Medications  iron sucrose (VENOFER) 200 mg in sodium chloride 0.9 % 100 mL IVPB (0 mg Intravenous Stopped 01/17/23 0748)  potassium chloride SA (KLOR-CON M) CR tablet 60 mEq (60 mEq Oral Given 01/17/23 0722)  magnesium sulfate IVPB 2 g 50 mL (2 g Intravenous New Bag/Given 01/17/23 0823)     IMPRESSION / MDM / ASSESSMENT AND PLAN / ED COURSE  I reviewed the triage vital signs and the nursing notes.  DDx: Electrolyte abnormality, hyperthyroidism, non-STEMI, pregnancy, anemia, AKI, medication noncompliance  Patient's presentation is most consistent with acute presentation with potential threat to life or bodily  function.  Patient comes to ED complaining of tachycardia.  She is prescribed diltiazem XR, but had not been taking it consistently, which I suspect to be the cause of her elevated heart rate.  Only mildly tachycardic on my exam.  Lab panel does reveal worsened anemia with hemoglobin today of 8.1 compared to her recent baseline of about 9.5.  Additionally, MCV is low, RDW is increased, consistent with iron deficiency, will give a Venofer infusion here.  Potassium is 2.9, mag is 1.4.  Will supplement.  Rest of the labs are normal.  Awaiting D-dimer  ----------------------------------------- 9:29 AM on 01/17/2023 ----------------------------------------- TSH normal.  D-dimer mildly elevated at 0.9.  Patient reports she is feeling better, denies any chest pain or shortness of breath still.  She declines CT scan to rule out PE currently, states that she feels normal.  Advised her to continue monitoring symptoms and return if any chest pain shortness of breath or other new concerns.       FINAL CLINICAL IMPRESSION(S) / ED DIAGNOSES   Final diagnoses:  Sinus tachycardia     Rx / DC Orders   ED Discharge Orders     None        Note:  This document was prepared using Dragon voice recognition software and may include unintentional dictation errors.   Sharman Cheek, MD 01/17/23 [phone]

## 2023-01-17 NOTE — ED Triage Notes (Signed)
Pt presents ambulatory to triage via POV with complaints of tachycardia. She states that she feels like she's running a marathon while sitting still. Pt is in NAD. She endorses a hx of asthma and has had to use her inhaler more today. A&Ox4 at this time. Denies CP or SOB.

## 2023-01-17 NOTE — Transitions of Care (Post Inpatient/ED Visit) (Signed)
   01/17/2023  Name: CATHRIN ERMEL MRN: [id number] DOB: 10/16/1985  Today's TOC FU Call Status: Today's TOC FU Call Status:: Unsuccessful Call (2nd Attempt) Unsuccessful Call (2nd Attempt) Date: 01/17/23  Attempted to reach the patient regarding the most recent Inpatient/ED visit.  Follow Up Plan: Additional outreach attempts will be made to reach the patient to complete the Transitions of Care (Post Inpatient/ED visit) call.   Signature Arthur Holms

## 2023-01-17 NOTE — ED Notes (Signed)
Pt refusing IV for CT angio, EDP made aware

## 2023-01-18 ENCOUNTER — Telehealth: Payer: Self-pay

## 2023-01-18 NOTE — Transitions of Care (Post Inpatient/ED Visit) (Signed)
   01/18/2023  Name: CAILEEN SALAS MRN: [id number] DOB: 1986/04/23  Today's TOC FU Call Status: Today's TOC FU Call Status:: Unsuccessful Call (3rd Attempt) Unsuccessful Call (3rd Attempt) Date: 01/18/23  Attempted to reach the patient regarding the most recent Inpatient/ED visit.  Follow Up Plan: No further outreach attempts will be made at this time. We have been unable to contact the patient.  Signature Arthur Holms

## 2023-01-18 NOTE — Telephone Encounter (Signed)
Called for [facility]

## 2023-02-21 ENCOUNTER — Encounter: Payer: Self-pay | Admitting: Pharmacist

## 2023-02-23 ENCOUNTER — Ambulatory Visit: Payer: BC Managed Care – PPO | Admitting: Family Medicine

## 2023-03-14 ENCOUNTER — Inpatient Hospital Stay: Payer: BC Managed Care – PPO | Admitting: Oncology

## 2023-03-14 ENCOUNTER — Inpatient Hospital Stay: Payer: BC Managed Care – PPO

## 2023-04-05 ENCOUNTER — Telehealth: Payer: Self-pay

## 2023-04-10 NOTE — Telephone Encounter (Signed)
Left voicemail for pharmacy to notify patient past due for annual. Please have patient contact office to schedule appointment. She has not read her my chart message.

## 2023-05-10 ENCOUNTER — Ambulatory Visit: Payer: BC Managed Care – PPO | Admitting: Oncology

## 2023-05-10 ENCOUNTER — Other Ambulatory Visit: Payer: BC Managed Care – PPO

## 2023-07-25 ENCOUNTER — Ambulatory Visit: Payer: BC Managed Care – PPO | Admitting: Family Medicine

## 2023-07-25 ENCOUNTER — Encounter: Payer: Self-pay | Admitting: Family Medicine

## 2023-07-25 VITALS — BP 154/96 | HR 108 | Ht 62.0 in | Wt 186.0 lb

## 2023-07-25 DIAGNOSIS — Z789 Other specified health status: Secondary | ICD-10-CM | POA: Diagnosis not present

## 2023-07-25 DIAGNOSIS — I10 Essential (primary) hypertension: Secondary | ICD-10-CM | POA: Diagnosis not present

## 2023-07-25 MED ORDER — HYDROCHLOROTHIAZIDE 25 MG PO TABS: 25.0000 mg | ORAL_TABLET | Freq: Every day | ORAL | 3 refills | Status: DC

## 2023-07-25 MED ORDER — DILTIAZEM HCL ER COATED BEADS 120 MG PO CP24
120.0000 mg | ORAL_CAPSULE | Freq: Every day | ORAL | 1 refills | Status: DC
Start: 2023-07-25 — End: 2023-09-05

## 2023-07-25 NOTE — Progress Notes (Signed)
 Date:  07/25/2023   Name:  Jessica Humphrey   DOB:  1985/12/18   MRN:  [id number]   Chief Complaint: Hypertension (Patient wants to discuss alternative BP med. She said lisinopril /hydrochlorothiazide  causes her face and lips to swell. She is not taking this but taking diltiazem ,.)  Hypertension This is a chronic problem. The current episode started more than 1 year ago. The problem has been gradually worsening since onset. The problem is uncontrolled. Associated symptoms include anxiety. Pertinent negatives include no blurred vision, chest pain, headaches, malaise/fatigue, neck pain, orthopnea, palpitations, peripheral edema, PND, shortness of breath or sweats. There are no associated agents to hypertension. Risk factors for coronary artery disease include stress. Past treatments include calcium channel blockers. The current treatment provides moderate improvement. There are no compliance problems.  There is no history of CAD/MI or CVA. There is no history of chronic renal disease, a hypertension causing med or renovascular disease.    Lab Results  Component Value Date   NA 137 01/17/2023   K 2.9 (L) 01/17/2023   CO2 23 01/17/2023   GLUCOSE 118 (H) 01/17/2023   BUN 8 01/17/2023   CREATININE 0.80 01/17/2023   CALCIUM 8.9 01/17/2023   EGFR 100 07/21/2022   GFRNONAA >60 01/17/2023   Lab Results  Component Value Date   CHOL 183 07/21/2022   HDL 64 07/21/2022   LDLCALC 82 07/21/2022   TRIG 226 (H) 07/21/2022   Lab Results  Component Value Date   TSH 1.779 01/17/2023   Lab Results  Component Value Date   HGBA1C 5.5 07/21/2022   Lab Results  Component Value Date   WBC 14.1 (H) 01/17/2023   HGB 8.1 (L) 01/17/2023   HCT 27.0 (L) 01/17/2023   MCV 71.8 (L) 01/17/2023   PLT 524 (H) 01/17/2023   Lab Results  Component Value Date   ALT 13 07/21/2022   AST 22 07/21/2022   ALKPHOS 56 07/21/2022   BILITOT 0.4 07/21/2022   No results found for: 25OHVITD2, 25OHVITD3,  VD25OH   Review of Systems  Constitutional:  Positive for diaphoresis. Negative for fatigue, malaise/fatigue and unexpected weight change.  HENT:  Negative for trouble swallowing.   Eyes:  Negative for blurred vision and visual disturbance.  Respiratory:  Negative for cough, choking, chest tightness, shortness of breath and wheezing.   Cardiovascular:  Negative for chest pain, palpitations, orthopnea, leg swelling and PND.  Gastrointestinal:  Negative for abdominal distention and anal bleeding.  Endocrine: Negative for polydipsia and polyuria.  Genitourinary:  Negative for difficulty urinating.  Musculoskeletal:  Negative for neck pain.  Neurological:  Negative for headaches.    Patient Active Problem List   Diagnosis Date Noted   Menorrhagia 11/07/2022   IDA (iron  deficiency anemia) 08/04/2022   HTN (hypertension), benign 11/03/2015   Asthma exacerbation 04/01/2015    No Known Allergies  Past Surgical History:  Procedure Laterality Date   CESAREAN SECTION     x 1    Social History   Tobacco Use   Smoking status: Former    Current packs/day: 0.00    Types: Cigarettes    Quit date: 04/12/2018    Years since quitting: 5.2   Smokeless tobacco: Never   Tobacco comments:    Very light smoker, about 1 cig a day   Vaping Use   Vaping status: Never Used  Substance Use Topics   Alcohol use: Not Currently    Comment: rare   Drug use: Never  Medication list has been reviewed and updated.  Current Meds  Medication Sig   albuterol  (VENTOLIN  HFA) 108 (90 Base) MCG/ACT inhaler Inhale 2 puffs into the lungs every 6 (six) hours as needed for wheezing or shortness of breath.   diltiazem  (CARDIZEM  CD) 120 MG 24 hr capsule Take 1 capsule (120 mg total) by mouth daily.   TRELEGY ELLIPTA 200-62.5-25 MCG/INH AEPB pulm   [DISCONTINUED] fluticasone  (FLONASE ) 50 MCG/ACT nasal spray Place 1 spray into both nostrils daily.       07/25/2023    8:43 AM 08/22/2022    3:23 PM  07/21/2022    1:43 PM 01/17/2022    3:06 PM  GAD 7 : Generalized Anxiety Score  Nervous, Anxious, on Edge 0 0 0 3  Control/stop worrying 0 0 0 2  Worry too much - different things 0 0 0 2  Trouble relaxing 1 0 0 2  Restless 0 0 0 2  Easily annoyed or irritable 0 0 0 2  Afraid - awful might happen 0 0 0 1  Total GAD 7 Score 1 0 0 14  Anxiety Difficulty Not difficult at all Not difficult at all Not difficult at all Somewhat difficult       07/25/2023    8:43 AM 08/22/2022    3:23 PM 07/21/2022    1:43 PM  Depression screen PHQ 2/9  Decreased Interest 0 0 0  Down, Depressed, Hopeless 0 0 0  PHQ - 2 Score 0 0 0  Altered sleeping 2 0 0  Tired, decreased energy 2 0 0  Change in appetite 0 0 0  Feeling bad or failure about yourself  0 0 0  Trouble concentrating 0 0 0  Moving slowly or fidgety/restless 0 0 0  Suicidal thoughts 0 0 0  PHQ-9 Score 4 0 0  Difficult doing work/chores Not difficult at all Not difficult at all Not difficult at all    BP Readings from Last 3 Encounters:  07/25/23 (!) 154/96  01/17/23 (!) 142/75  11/23/22 (!) 219/117    Physical Exam Vitals and nursing note reviewed.  Constitutional:      General: She is not in acute distress.    Appearance: She is not diaphoretic.  HENT:     Head: Normocephalic and atraumatic.     Right Ear: External ear normal.     Left Ear: External ear normal.     Nose: Nose normal.  Eyes:     General:        Right eye: No discharge.        Left eye: No discharge.     Conjunctiva/sclera: Conjunctivae normal.     Pupils: Pupils are equal, round, and reactive to light.  Neck:     Thyroid : No thyromegaly.     Vascular: No JVD.  Cardiovascular:     Rate and Rhythm: Normal rate and regular rhythm.     Heart sounds: Normal heart sounds. No murmur heard.    No friction rub. No gallop.  Pulmonary:     Effort: Pulmonary effort is normal.     Breath sounds: Normal breath sounds. No wheezing, rhonchi or rales.  Abdominal:      General: Bowel sounds are normal.     Palpations: Abdomen is soft. There is no mass.     Tenderness: There is no abdominal tenderness. There is no guarding.  Musculoskeletal:     Cervical back: Normal range of motion and neck supple.  Lymphadenopathy:  Cervical: No cervical adenopathy.  Skin:    General: Skin is warm and dry.  Neurological:     Mental Status: She is alert.     Deep Tendon Reflexes: Reflexes are normal and symmetric.     Reflex Scores:      Patellar reflexes are 2+ on the right side and 2+ on the left side.    Wt Readings from Last 3 Encounters:  07/25/23 186 lb (84.4 kg)  01/17/23 178 lb (80.7 kg)  11/07/22 181 lb 1.6 oz (82.1 kg)    BP (!) 154/96   Pulse (!) 108   Ht 5' 2 (1.575 m)   Wt 186 lb (84.4 kg)   LMP 07/24/2023 (Exact Date)   SpO2 97%   BMI 34.02 kg/m   Assessment and Plan: 1. Essential hypertension (Primary) Chronic.  Uncontrolled.  Stable.  Asymptomatic.  Current blood pressure is 154/96.  Tolerating current medication of diltiazem  only 120 mg once a day.  About 6 weeks ago patient experienced angioedema and she stopped her combination lisinopril  hydrochlorothiazide .  I suspect this was the ACE portion of the combination and I concur with the discontinuance however since blood pressure has remained elevated since we will resume and we will add the hydrochlorothiazide  and cautiously half a tablet first dose and then second dose the entire 25 mg tablet.  Will recheck patient in 6 weeks concerning her blood pressure and if maintained we will continue to not on this regimen and we will recheck renal panel at that time for electrolytes and GFR.  Patient is to call if she has any issues or difficulties taking hydrochlorothiazide  25 mg. - diltiazem  (CARDIZEM  CD) 120 MG 24 hr capsule; Take 1 capsule (120 mg total) by mouth daily.  Dispense: 90 capsule; Refill: 1 - hydrochlorothiazide  (HYDRODIURIL ) 25 MG tablet; Take 1 tablet (25 mg total) by mouth daily.   Dispense: 90 tablet; Refill: 3  2. ACE inhibitor intolerance As noted above there is an intolerance to the lisinopril  which caused an angioedema like reaction 1 night about 6 weeks ago has not recurred.  We will document this in her history and we will avoid ACE and ARB's in the future.    Cathryne Molt, MD

## 2023-09-05 ENCOUNTER — Ambulatory Visit (INDEPENDENT_AMBULATORY_CARE_PROVIDER_SITE_OTHER): Payer: BC Managed Care – PPO | Admitting: Family Medicine

## 2023-09-05 ENCOUNTER — Encounter: Payer: Self-pay | Admitting: Family Medicine

## 2023-09-05 VITALS — BP 144/92 | HR 107 | Ht 62.0 in | Wt 188.0 lb

## 2023-09-05 DIAGNOSIS — I10 Essential (primary) hypertension: Secondary | ICD-10-CM

## 2023-09-05 DIAGNOSIS — J4520 Mild intermittent asthma, uncomplicated: Secondary | ICD-10-CM

## 2023-09-05 MED ORDER — HYDROCHLOROTHIAZIDE 25 MG PO TABS
25.0000 mg | ORAL_TABLET | Freq: Every day | ORAL | 3 refills | Status: AC
Start: 2023-09-05 — End: ?

## 2023-09-05 MED ORDER — DILTIAZEM HCL ER COATED BEADS 180 MG PO CP24
180.0000 mg | ORAL_CAPSULE | Freq: Every day | ORAL | 0 refills | Status: AC
Start: 2023-09-05 — End: ?

## 2023-09-05 MED ORDER — ALBUTEROL SULFATE HFA 108 (90 BASE) MCG/ACT IN AERS
2.0000 | INHALATION_SPRAY | Freq: Four times a day (QID) | RESPIRATORY_TRACT | 11 refills | Status: AC | PRN
Start: 2023-09-05 — End: ?

## 2023-09-05 NOTE — Progress Notes (Addendum)
 Date:  09/05/2023   Name:  Jessica Humphrey   DOB:  1986/01/21   MRN:  [id number]   Chief Complaint: Hypertension  Hypertension This is a chronic problem. The current episode started more than 1 year ago. The problem has been gradually worsening since onset. The problem is uncontrolled. Associated symptoms include palpitations. Pertinent negatives include no anxiety, blurred vision, chest pain, headaches, malaise/fatigue, neck pain, orthopnea, peripheral edema, PND, shortness of breath or sweats. There are no associated agents to hypertension. Past treatments include calcium channel blockers and diuretics. The current treatment provides mild improvement. There are no compliance problems.  There is no history of CAD/MI or CVA. There is no history of chronic renal disease, a hypertension causing med or renovascular disease.  Asthma There is no chest tightness, cough, difficulty breathing, frequent throat clearing, hemoptysis, shortness of breath, sputum production or wheezing. This is a chronic problem. The current episode started more than 1 year ago. The problem occurs intermittently. The problem has been gradually improving. Pertinent negatives include no chest pain, headaches, malaise/fatigue, orthopnea, PND, postnasal drip, rhinorrhea, sneezing or sweats. Her symptoms are alleviated by beta-agonist. She reports moderate improvement on treatment. Her past medical history is significant for asthma.    Lab Results  Component Value Date   NA 137 01/17/2023   K 2.9 (L) 01/17/2023   CO2 23 01/17/2023   GLUCOSE 118 (H) 01/17/2023   BUN 8 01/17/2023   CREATININE 0.80 01/17/2023   CALCIUM 8.9 01/17/2023   EGFR 100 07/21/2022   GFRNONAA >60 01/17/2023   Lab Results  Component Value Date   CHOL 183 07/21/2022   HDL 64 07/21/2022   LDLCALC 82 07/21/2022   TRIG 226 (H) 07/21/2022   Lab Results  Component Value Date   TSH 1.779 01/17/2023   Lab Results  Component Value Date   HGBA1C  5.5 07/21/2022   Lab Results  Component Value Date   WBC 14.1 (H) 01/17/2023   HGB 8.1 (L) 01/17/2023   HCT 27.0 (L) 01/17/2023   MCV 71.8 (L) 01/17/2023   PLT 524 (H) 01/17/2023   Lab Results  Component Value Date   ALT 13 07/21/2022   AST 22 07/21/2022   ALKPHOS 56 07/21/2022   BILITOT 0.4 07/21/2022   No results found for: "25OHVITD2", "25OHVITD3", "VD25OH"   Review of Systems  Constitutional:  Negative for malaise/fatigue and unexpected weight change.  HENT:  Negative for postnasal drip, rhinorrhea and sneezing.   Eyes:  Negative for blurred vision.  Respiratory:  Negative for cough, hemoptysis, sputum production, shortness of breath and wheezing.   Cardiovascular:  Positive for palpitations. Negative for chest pain, orthopnea and PND.  Gastrointestinal:  Negative for abdominal pain and blood in stool.  Endocrine: Negative for polydipsia and polyuria.  Genitourinary:  Negative for difficulty urinating, hematuria, menstrual problem and vaginal bleeding.  Musculoskeletal:  Negative for neck pain.  Neurological:  Negative for headaches.    Patient Active Problem List   Diagnosis Date Noted   Menorrhagia 11/07/2022   IDA (iron deficiency anemia) 08/04/2022   HTN (hypertension), benign 11/03/2015   Asthma exacerbation 04/01/2015    No Known Allergies  Past Surgical History:  Procedure Laterality Date   CESAREAN SECTION     x 1    Social History   Tobacco Use   Smoking status: Former    Current packs/day: 0.00    Types: Cigarettes    Quit date: 04/12/2018    Years since quitting: 5.4  Smokeless tobacco: Never   Tobacco comments:    Very light smoker, about 1 cig a day   Vaping Use   Vaping status: Never Used  Substance Use Topics   Alcohol use: Not Currently    Comment: rare   Drug use: Never     Medication list has been reviewed and updated.  Current Meds  Medication Sig   albuterol (VENTOLIN HFA) 108 (90 Base) MCG/ACT inhaler Inhale 2 puffs  into the lungs every 6 (six) hours as needed for wheezing or shortness of breath.   diltiazem (CARDIZEM CD) 120 MG 24 hr capsule Take 1 capsule (120 mg total) by mouth daily.   hydrochlorothiazide (HYDRODIURIL) 25 MG tablet Take 1 tablet (25 mg total) by mouth daily.   TRELEGY ELLIPTA 200-62.5-25 MCG/INH AEPB pulm       09/05/2023    8:28 AM 07/25/2023    8:43 AM 08/22/2022    3:23 PM 07/21/2022    1:43 PM  GAD 7 : Generalized Anxiety Score  Nervous, Anxious, on Edge 1 0 0 0  Control/stop worrying 0 0 0 0  Worry too much - different things 1 0 0 0  Trouble relaxing 0 1 0 0  Restless 0 0 0 0  Easily annoyed or irritable 0 0 0 0  Afraid - awful might happen 0 0 0 0  Total GAD 7 Score 2 1 0 0  Anxiety Difficulty Not difficult at all Not difficult at all Not difficult at all Not difficult at all       09/05/2023    8:28 AM 07/25/2023    8:43 AM 08/22/2022    3:23 PM  Depression screen PHQ 2/9  Decreased Interest 0 0 0  Down, Depressed, Hopeless 0 0 0  PHQ - 2 Score 0 0 0  Altered sleeping  2 0  Tired, decreased energy  2 0  Change in appetite  0 0  Feeling bad or failure about yourself   0 0  Trouble concentrating  0 0  Moving slowly or fidgety/restless  0 0  Suicidal thoughts  0 0  PHQ-9 Score  4 0  Difficult doing work/chores  Not difficult at all Not difficult at all    BP Readings from Last 3 Encounters:  09/05/23 (!) 162/102  07/25/23 (!) 154/96  01/17/23 (!) 142/75    Physical Exam Vitals and nursing note reviewed.  Constitutional:      General: She is not in acute distress.    Appearance: She is not diaphoretic.  HENT:     Head: Normocephalic and atraumatic.     Right Ear: External ear normal.     Left Ear: External ear normal. There is impacted cerumen.     Nose: Nose normal. No congestion.     Mouth/Throat:     Mouth: Mucous membranes are moist.  Eyes:     General:        Right eye: No discharge.        Left eye: No discharge.     Conjunctiva/sclera:  Conjunctivae normal.     Pupils: Pupils are equal, round, and reactive to light.  Neck:     Thyroid: No thyromegaly.     Vascular: No JVD.  Cardiovascular:     Rate and Rhythm: Normal rate and regular rhythm.     Heart sounds: Normal heart sounds, S1 normal and S2 normal. No murmur heard.    No systolic murmur is present.     No diastolic  murmur is present.     No friction rub. No gallop.  Pulmonary:     Effort: Pulmonary effort is normal.     Breath sounds: Normal breath sounds. No wheezing, rhonchi or rales.  Abdominal:     General: Bowel sounds are normal.     Palpations: Abdomen is soft. There is no mass.     Tenderness: There is no abdominal tenderness. There is no guarding.  Musculoskeletal:        General: Normal range of motion.     Cervical back: Normal range of motion and neck supple.  Lymphadenopathy:     Cervical: No cervical adenopathy.  Skin:    General: Skin is warm and dry.  Neurological:     Mental Status: She is alert.     Wt Readings from Last 3 Encounters:  09/05/23 188 lb (85.3 kg)  07/25/23 186 lb (84.4 kg)  01/17/23 178 lb (80.7 kg)    BP (!) 162/102   Pulse (!) 107   Ht 5\' 2"  (1.575 m)   Wt 188 lb (85.3 kg)   SpO2 99%   BMI 34.39 kg/m   Assessment and Plan: 1. Essential hypertension (Primary) Chronic.  Uncontrolled.  Stable.  Blood pressure elevated upon repeat to 144/92.  Will discontinue current dosing of 120 mg and increase to 180 mg CD once a day and will have patient return in 6 weeks for recheck of blood pressure.  In the meantime reemphasized low-cholesterol low sodium guidelines.  Will also and continue her hydrochlorothiazide at 25 mg. - diltiazem (CARDIZEM CD) 180 MG 24 hr capsule; Take 1 capsule (180 mg total) by mouth daily.  Dispense: 60 capsule; Refill: 0 - hydrochlorothiazide (HYDRODIURIL) 25 MG tablet; Take 1 tablet (25 mg total) by mouth daily.  Dispense: 90 tablet; Refill: 3  2. Mild intermittent asthma, unspecified whether  complicated Chronic.  Intermittent.  Stable.  Mild in intensity.  Currently controlled with albuterol 1 to 2 puffs every 6 hours but patient would like to resume her Trelegy 200 mg and she sees pulmonary and I have reinforced that she needs to resume contact with her specialist and that we may need them in the future and we need to maintain maintenance.  Patient will contact her pulmonologist at [facility] clinic and follow-up. - albuterol (VENTOLIN HFA) 108 (90 Base) MCG/ACT inhaler; Inhale 2 puffs into the lungs every 6 (six) hours as needed for wheezing or shortness of breath.  Dispense: 18 g; Refill: 11     Elizabeth Sauer, MD

## 2023-09-05 NOTE — Patient Instructions (Signed)
Low-Sodium Eating Plan Salt (sodium) helps you keep a healthy balance of fluids in your body. Too much sodium can raise your blood pressure. It can also cause fluid and waste to be held in your body. Your health care provider or dietitian may recommend a low-sodium eating plan if you have high blood pressure (hypertension), kidney disease, liver disease, or heart failure. Eating less sodium can help lower your blood pressure and reduce swelling. It can also protect your heart, liver, and kidneys. What are tips for following this plan? Reading food labels  Check food labels for the amount of sodium per serving. If you eat more than one serving, you must multiply the listed amount by the number of servings. Choose foods with less than 140 milligrams (mg) of sodium per serving. Avoid foods with 300 mg of sodium or more per serving. Always check how much sodium is in a product, even if the label says "unsalted" or "no salt added." Shopping  Buy products labeled as "low-sodium" or "no salt added." Buy fresh foods. Avoid canned foods and pre-made or frozen meals. Avoid canned, cured, or processed meats. Buy breads that have less than 80 mg of sodium per slice. Cooking  Eat more home-cooked food. Try to eat less restaurant, buffet, and fast food. Try not to add salt when you cook. Use salt-free seasonings or herbs instead of table salt or sea salt. Check with your provider or pharmacist before using salt substitutes. Cook with plant-based oils, such as canola, sunflower, or olive oil. Meal planning When eating at a restaurant, ask if your food can be made with less salt or no salt. Avoid dishes labeled as brined, pickled, cured, or smoked. Avoid dishes made with soy sauce, miso, or teriyaki sauce. Avoid foods that have monosodium glutamate (MSG) in them. MSG may be added to some restaurant food, sauces, soups, bouillon, and canned foods. Make meals that can be grilled, baked, poached, roasted, or  steamed. These are often made with less sodium. General information Try to limit your sodium intake to 1,500-2,300 mg each day, or the amount told by your provider. What foods should I eat? Fruits Fresh, frozen, or canned fruit. Fruit juice. Vegetables Fresh or frozen vegetables. "No salt added" canned vegetables. "No salt added" tomato sauce and paste. Low-sodium or reduced-sodium tomato and vegetable juice. Grains Low-sodium cereals, such as oats, puffed wheat and rice, and shredded wheat. Low-sodium crackers. Unsalted rice. Unsalted pasta. Low-sodium bread. Whole grain breads and whole grain pasta. Meats and other proteins Fresh or frozen meat, poultry, seafood, and fish. These should have no added salt. Low-sodium canned tuna and salmon. Unsalted nuts. Dried peas, beans, and lentils without added salt. Unsalted canned beans. Eggs. Unsalted nut butters. Dairy Milk. Soy milk. Cheese that is naturally low in sodium, such as ricotta cheese, fresh mozzarella, or Swiss cheese. Low-sodium or reduced-sodium cheese. Cream cheese. Yogurt. Seasonings and condiments Fresh and dried herbs and spices. Salt-free seasonings. Low-sodium mustard and ketchup. Sodium-free salad dressing. Sodium-free light mayonnaise. Fresh or refrigerated horseradish. Lemon juice. Vinegar. Other foods Homemade, reduced-sodium, or low-sodium soups. Unsalted popcorn and pretzels. Low-salt or salt-free chips. The items listed above may not be all the foods and drinks you can have. Talk to a dietitian to learn more. What foods should I avoid? Vegetables Sauerkraut, pickled vegetables, and relishes. Olives. [place] fries. Onion rings. Regular canned vegetables, except low-sodium or reduced-sodium items. Regular canned tomato sauce and paste. Regular tomato and vegetable juice. Frozen vegetables in sauces. Grains Instant  hot cereals. Bread stuffing, pancake, and biscuit mixes. Croutons. Seasoned rice or pasta mixes. Noodle soup  cups. Boxed or frozen macaroni and cheese. Regular salted crackers. Self-rising flour. Meats and other proteins Meat or fish that is salted, canned, smoked, spiced, or pickled. Precooked or cured meat, such as sausages or meat loaves. Tomasa Blase. Ham. Pepperoni. Hot dogs. Corned beef. Chipped beef. Salt pork. Jerky. Pickled herring, anchovies, and sardines. Regular canned tuna. Salted nuts. Dairy Processed cheese and cheese spreads. Hard cheeses. Cheese curds. Blue cheese. Feta cheese. String cheese. Regular cottage cheese. Buttermilk. Canned milk. Fats and oils Salted butter. Regular margarine. Ghee. Bacon fat. Seasonings and condiments Onion salt, garlic salt, seasoned salt, table salt, and sea salt. Canned and packaged gravies. Worcestershire sauce. Tartar sauce. Barbecue sauce. Teriyaki sauce. Soy sauce, including reduced-sodium soy sauce. Steak sauce. Fish sauce. Oyster sauce. Cocktail sauce. Horseradish that you find on the shelf. Regular ketchup and mustard. Meat flavorings and tenderizers. Bouillon cubes. Hot sauce. Pre-made or packaged marinades. Pre-made or packaged taco seasonings. Relishes. Regular salad dressings. Salsa. Other foods Salted popcorn and pretzels. Corn chips and puffs. Potato and tortilla chips. Canned or dried soups. Pizza. Frozen entrees and pot pies. The items listed above may not be all the foods and drinks you should avoid. Talk to a dietitian to learn more. This information is not intended to replace advice given to you by your health care provider. Make sure you discuss any questions you have with your health care provider. Document Revised: 06/23/2022 Document Reviewed: 06/23/2022 Elsevier Patient Education  2024 [employer].

## 2023-10-19 ENCOUNTER — Ambulatory Visit: Admitting: Family Medicine

## 2023-10-31 ENCOUNTER — Ambulatory Visit: Admitting: Family Medicine

## 2024-06-28 ENCOUNTER — Encounter: Payer: Self-pay | Admitting: Oncology
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
## 2024-07-03 ENCOUNTER — Encounter: Payer: Self-pay | Admitting: Obstetrics

## 2024-07-03 ENCOUNTER — Encounter: Payer: Self-pay | Admitting: Oncology

## 2024-07-03 ENCOUNTER — Other Ambulatory Visit (HOSPITAL_COMMUNITY)
Admission: RE | Admit: 2024-07-03 | Discharge: 2024-07-03 | Disposition: A | Source: Ambulatory Visit | Attending: Obstetrics | Admitting: Obstetrics

## 2024-07-03 ENCOUNTER — Ambulatory Visit: Admitting: Obstetrics

## 2024-07-03 VITALS — BP 144/92 | HR 121 | Wt 175.0 lb

## 2024-07-03 DIAGNOSIS — Z01419 Encounter for gynecological examination (general) (routine) without abnormal findings: Secondary | ICD-10-CM | POA: Diagnosis not present

## 2024-07-03 DIAGNOSIS — Z23 Encounter for immunization: Secondary | ICD-10-CM

## 2024-07-03 DIAGNOSIS — Z124 Encounter for screening for malignant neoplasm of cervix: Secondary | ICD-10-CM | POA: Insufficient documentation

## 2024-07-03 DIAGNOSIS — Z30015 Encounter for initial prescription of vaginal ring hormonal contraceptive: Secondary | ICD-10-CM

## 2024-07-03 DIAGNOSIS — Z1231 Encounter for screening mammogram for malignant neoplasm of breast: Secondary | ICD-10-CM

## 2024-07-03 DIAGNOSIS — Z113 Encounter for screening for infections with a predominantly sexual mode of transmission: Secondary | ICD-10-CM

## 2024-07-03 MED ORDER — ETONOGESTREL-ETHINYL ESTRADIOL 0.12-0.015 MG/24HR VA RING: VAGINAL_RING | VAGINAL | 4 refills | Status: AC

## 2024-07-03 NOTE — Patient Instructions (Addendum)
 Human Papillomavirus (HPV) Vaccine Injection What is this medication? HUMAN PAPILLOMAVIRUS VACCINE (HYOO muhn pap uh LOH muh vahy ruhs vak SEEN) reduces the risk of human papillomavirus (HPV). It does not treat HPV. It is still possible to get HPV after receiving this vaccine, but the symptoms may be less severe or not last as long. It works by helping your immune system learn how to fight off a future infection. This medicine may be used for other purposes; ask your health care provider or pharmacist if you have questions. COMMON BRAND NAME(S): Gardasil 9 What should I tell my care team before I take this medication? They need to know if you have any of these conditions: Fever Hemophilia HIV or AIDS Immune system problems Infection Low platelets An unusual reaction to human papillomavirus vaccine, yeast, other vaccines, other medications, foods, dyes, or preservatives Pregnant or trying to get pregnant Breastfeeding How should I use this medication? This vaccine is injected into a muscle. It is given by your care team. This vaccine requires 2 or 3 doses to get the full benefit. Set a reminder for when your next dose is due. A copy of the Vaccine Information Statement will be given before each vaccination. Be sure to read this information carefully each time. This sheet may change often. Talk to your care team about the use of this medication in children. While it may be prescribed for children as young as 9 years for selected conditions, precautions do apply. Overdosage: If you think you have taken too much of this medicine contact a poison control center or emergency room at once. NOTE: This medicine is only for you. Do not share this medicine with others. What if I miss a dose? Keep appointments for follow-up doses as directed. It is important not to miss your dose. Call your care team if you are unable to keep an appointment. What may interact with this medication? Certain medications  for arthritis Medications for organ transplant Medications to treat cancer Steroid medications, such as prednisone  or cortisone This list may not describe all possible interactions. Give your health care provider a list of all the medicines, herbs, non-prescription drugs, or dietary supplements you use. Also tell them if you smoke, drink alcohol, or use illegal drugs. Some items may interact with your medicine. What should I watch for while using this medication? Visit your care team regularly. Report any side effects to your care team right away. This vaccine, like all vaccines, may not fully protect everyone. What side effects may I notice from receiving this medication? Side effects that you should report to your care team as soon as possible: Allergic reactions--skin rash, itching, hives, swelling of the face, lips, tongue, or throat Feeling faint or lightheaded Side effects that usually do not require medical attention (report these to your care team if they continue or are bothersome): Diarrhea Dizziness Fatigue Fever Headache Nausea Pain, redness, irritation, or bruising at the injection site This list may not describe all possible side effects. Call your doctor for medical advice about side effects. You may report side effects to FDA at 1-800-FDA-1088. Where should I keep my medication? This vaccine is only given by your care team. It will not be stored at home. NOTE: This sheet is a summary. It may not cover all possible information. If you have questions about this medicine, talk to your doctor, pharmacist, or health care provider.  2024 Elsevier/Gold Standard (2021-11-17 00:00:00) Preventive Care 13-27 Years Old, Female Preventive care refers to lifestyle  choices and visits with your health care provider that can promote health and wellness. Preventive care visits are also called wellness exams. What can I expect for my preventive care visit? Counseling Your health care  provider may ask you questions about your: Medical history, including: Past medical problems. Family medical history. Pregnancy history. Current health, including: Menstrual cycle. Method of birth control. Emotional well-being. Home life and relationship well-being. Sexual activity and sexual health. Lifestyle, including: Alcohol, nicotine or tobacco, and drug use. Access to firearms. Diet, exercise, and sleep habits. Work and work astronomer. Sunscreen use. Safety issues such as seatbelt and bike helmet use. Physical exam Your health care provider will check your: Height and weight. These may be used to calculate your BMI (body mass index). BMI is a measurement that tells if you are at a healthy weight. Waist circumference. This measures the distance around your waistline. This measurement also tells if you are at a healthy weight and may help predict your risk of certain diseases, such as type 2 diabetes and high blood pressure. Heart rate and blood pressure. Body temperature. Skin for abnormal spots. What immunizations do I need?  Vaccines are usually given at various ages, according to a schedule. Your health care provider will recommend vaccines for you based on your age, medical history, and lifestyle or other factors, such as travel or where you work. What tests do I need? Screening Your health care provider may recommend screening tests for certain conditions. This may include: Lipid and cholesterol levels. Diabetes screening. This is done by checking your blood sugar (glucose) after you have not eaten for a while (fasting). Pelvic exam and Pap test. Hepatitis B test. Hepatitis C test. HIV (human immunodeficiency virus) test. STI (sexually transmitted infection) testing, if you are at risk. Lung cancer screening. Colorectal cancer screening. Mammogram. Talk with your health care provider about when you should start having regular mammograms. This may depend on whether  you have a family history of breast cancer. BRCA-related cancer screening. This may be done if you have a family history of breast, ovarian, tubal, or peritoneal cancers. Bone density scan. This is done to screen for osteoporosis. Talk with your health care provider about your test results, treatment options, and if necessary, the need for more tests. Follow these instructions at home: Eating and drinking  Eat a diet that includes fresh fruits and vegetables, whole grains, lean protein, and low-fat dairy products. Take vitamin and mineral supplements as recommended by your health care provider. Do not drink alcohol if: Your health care provider tells you not to drink. You are pregnant, may be pregnant, or are planning to become pregnant. If you drink alcohol: Limit how much you have to 0-1 drink a day. Know how much alcohol is in your drink. In the [place] one drink equals one 12 oz bottle of beer (355 mL), one 5 oz glass of wine (148 mL), or one 1 oz glass of hard liquor (44 mL). Lifestyle Brush your teeth every morning and night with fluoride toothpaste. Floss one time each day. Exercise for at least 30 minutes 5 or more days each week. Do not use any products that contain nicotine or tobacco. These products include cigarettes, chewing tobacco, and vaping devices, such as e-cigarettes. If you need help quitting, ask your health care provider. Do not use drugs. If you are sexually active, practice safe sex. Use a condom or other form of protection to prevent STIs. If you do not wish to become pregnant,  use a form of birth control. If you plan to become pregnant, see your health care provider for a prepregnancy visit. Take aspirin only as told by your health care provider. Make sure that you understand how much to take and what form to take. Work with your health care provider to find out whether it is safe and beneficial for you to take aspirin daily. Find healthy ways to manage stress, such  as: Meditation, yoga, or listening to music. Journaling. Talking to a trusted person. Spending time with friends and family. Minimize exposure to UV radiation to reduce your risk of skin cancer. Safety Always wear your seat belt while driving or riding in a vehicle. Do not drive: If you have been drinking alcohol. Do not ride with someone who has been drinking. When you are tired or distracted. While texting. If you have been using any mind-altering substances or drugs. Wear a helmet and other protective equipment during sports activities. If you have firearms in your house, make sure you follow all gun safety procedures. Seek help if you have been physically or sexually abused. What's next? Visit your health care provider once a year for an annual wellness visit. Ask your health care provider how often you should have your eyes and teeth checked. Stay up to date on all vaccines. This information is not intended to replace advice given to you by your health care provider. Make sure you discuss any questions you have with your health care provider. Document Revised: 12/02/2020 Document Reviewed: 12/02/2020 Elsevier Patient Education  2024 [employer].

## 2024-07-04 LAB — HEP, RPR, HIV PANEL
HIV Screen 4th Generation wRfx: NONREACTIVE
Hepatitis B Surface Ag: NEGATIVE
RPR Ser Ql: NONREACTIVE

## 2024-07-05 LAB — CYTOLOGY - PAP
Chlamydia: NEGATIVE
Comment: NEGATIVE
Comment: NEGATIVE
Comment: NEGATIVE
Comment: NORMAL
Diagnosis: NEGATIVE
High risk HPV: NEGATIVE
Neisseria Gonorrhea: NEGATIVE
Trichomonas: NEGATIVE

## 2024-08-30 ENCOUNTER — Ambulatory Visit
